# Patient Record
Sex: Female | Born: 1935 | Race: White | Hispanic: No | Marital: Married | Smoking: Never smoker
Health system: Southern US, Community
[De-identification: ages and names within clinical notes are randomized; demographics above are authoritative.]

## PROBLEM LIST (undated history)

## (undated) DIAGNOSIS — I10 Essential (primary) hypertension: Secondary | ICD-10-CM

## (undated) DIAGNOSIS — E039 Hypothyroidism, unspecified: Secondary | ICD-10-CM

## (undated) DIAGNOSIS — E785 Hyperlipidemia, unspecified: Secondary | ICD-10-CM

## (undated) DIAGNOSIS — I4891 Unspecified atrial fibrillation: Secondary | ICD-10-CM

## (undated) DIAGNOSIS — K219 Gastro-esophageal reflux disease without esophagitis: Secondary | ICD-10-CM

## (undated) DIAGNOSIS — I2510 Atherosclerotic heart disease of native coronary artery without angina pectoris: Secondary | ICD-10-CM

## (undated) DIAGNOSIS — Z950 Presence of cardiac pacemaker: Secondary | ICD-10-CM

## (undated) HISTORY — PX: PACEMAKER INSERTION: SHX728

## (undated) HISTORY — PX: ABDOMINAL HYSTERECTOMY: SHX81

## (undated) HISTORY — PX: CARDIAC SURGERY: SHX584

---

## 1987-03-05 HISTORY — PX: CHOLECYSTECTOMY: SHX55

## 2003-03-05 HISTORY — PX: CORONARY ARTERY BYPASS GRAFT: SHX141

## 2003-11-15 ENCOUNTER — Inpatient Hospital Stay (HOSPITAL_COMMUNITY): Admission: RE | Admit: 2003-11-15 | Discharge: 2003-12-02 | Payer: Self-pay | Admitting: Cardiothoracic Surgery

## 2003-11-15 ENCOUNTER — Ambulatory Visit: Payer: Self-pay | Admitting: Cardiothoracic Surgery

## 2003-11-17 ENCOUNTER — Encounter (INDEPENDENT_AMBULATORY_CARE_PROVIDER_SITE_OTHER): Payer: Self-pay | Admitting: *Deleted

## 2003-12-23 ENCOUNTER — Encounter: Admission: RE | Admit: 2003-12-23 | Discharge: 2003-12-23 | Payer: Self-pay | Admitting: Cardiothoracic Surgery

## 2004-10-08 ENCOUNTER — Ambulatory Visit: Payer: Self-pay | Admitting: *Deleted

## 2004-10-12 ENCOUNTER — Ambulatory Visit: Payer: Self-pay | Admitting: *Deleted

## 2004-11-20 ENCOUNTER — Ambulatory Visit: Payer: Self-pay | Admitting: *Deleted

## 2005-01-31 ENCOUNTER — Ambulatory Visit: Payer: Self-pay | Admitting: *Deleted

## 2005-03-06 ENCOUNTER — Ambulatory Visit: Payer: Self-pay | Admitting: *Deleted

## 2005-04-02 IMAGING — CR DG CHEST 2V
2 series · 2 of 2 positions shown · non-contrast
Comparison: none

CLINICAL DATA: Post-op open heart surgery.  Dyspnea.  
 PA AND LATERAL CHEST: 
 Comparison 11/25/03.  There is stable cardiomegaly status post median sternotomy, CABG, and tricuspid valve replacement.  Mild vascular congestion is present, but there is no overt pulmonary edema.  The bilateral pleural effusions have improved from the prior study.  Mild bibasilar atelectasis remains.  There is no pneumothorax.

[view not recorded (1 of 2)]
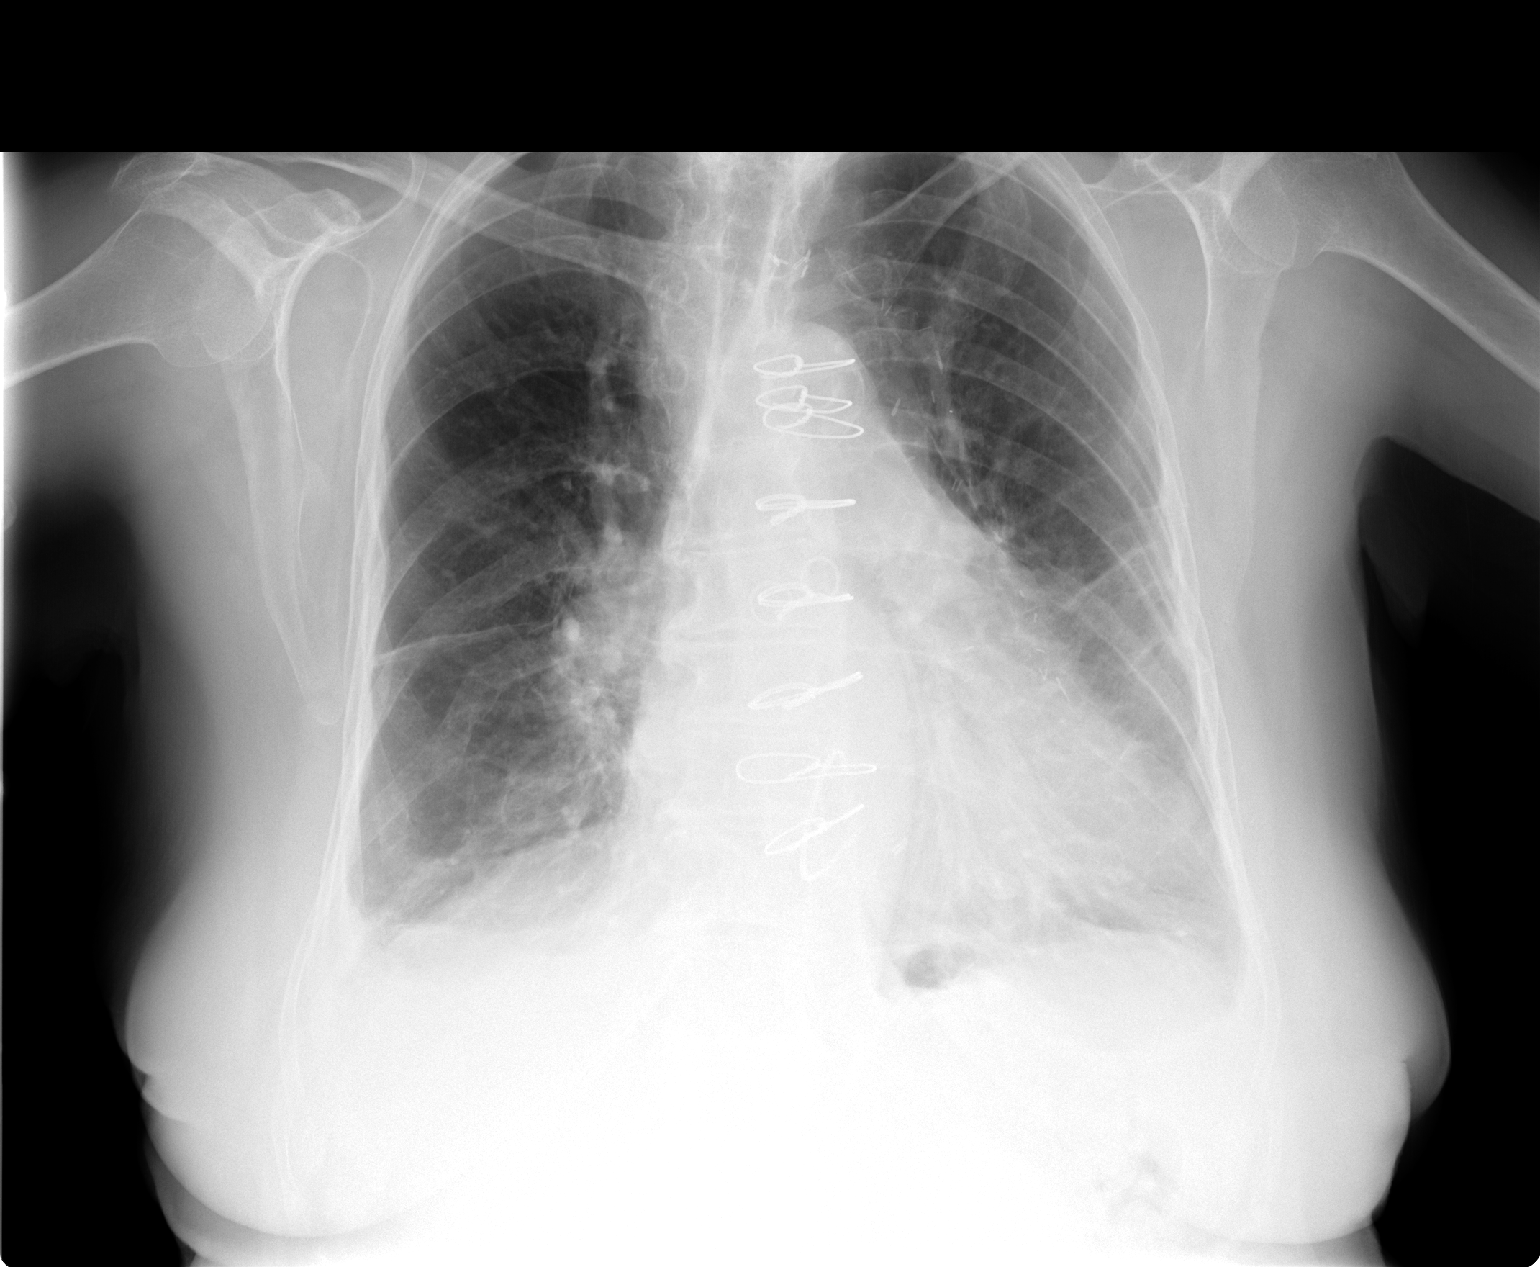

[view not recorded (2 of 2)]
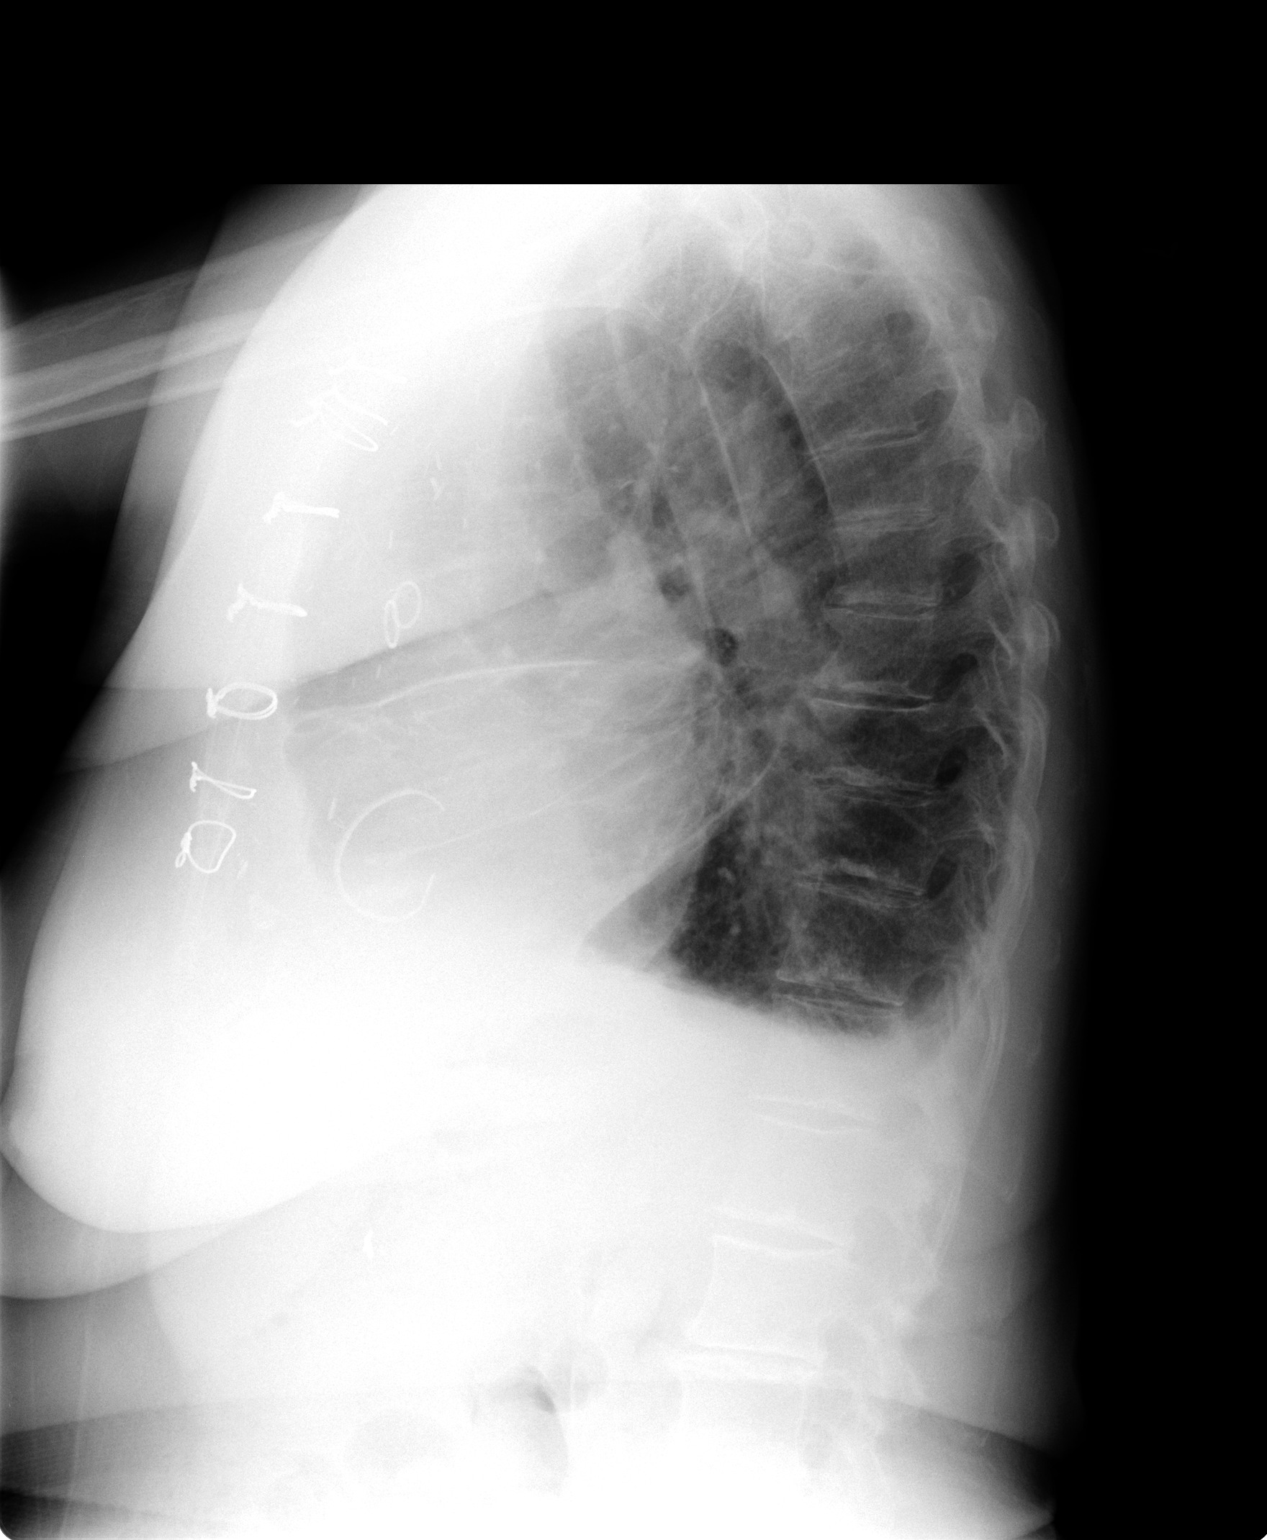

[2 of 2 positions shown; findings below may reference images not displayed]

IMPRESSION: Improved bilateral pleural effusions since 11/25/03.  There is persistent vascular congestion with mild bibasilar atelectasis.

## 2005-05-24 ENCOUNTER — Ambulatory Visit: Payer: Self-pay | Admitting: *Deleted

## 2005-09-05 ENCOUNTER — Ambulatory Visit: Payer: Self-pay | Admitting: *Deleted

## 2006-10-22 ENCOUNTER — Ambulatory Visit: Payer: Self-pay | Admitting: Internal Medicine

## 2006-11-24 ENCOUNTER — Ambulatory Visit: Payer: Self-pay | Admitting: Cardiothoracic Surgery

## 2009-04-18 ENCOUNTER — Observation Stay: Payer: Self-pay | Admitting: Internal Medicine

## 2009-09-01 DIAGNOSIS — Z950 Presence of cardiac pacemaker: Secondary | ICD-10-CM

## 2009-09-01 HISTORY — DX: Presence of cardiac pacemaker: Z95.0

## 2009-09-25 ENCOUNTER — Ambulatory Visit: Payer: Self-pay | Admitting: Cardiology

## 2009-09-26 ENCOUNTER — Ambulatory Visit: Payer: Self-pay | Admitting: Cardiology

## 2010-03-24 ENCOUNTER — Encounter: Payer: Self-pay | Admitting: Cardiothoracic Surgery

## 2010-07-20 NOTE — Op Note (Signed)
NAME:  ZALAYA, ASTARITA                   ACCOUNT NO.:  [id number]   MEDICAL RECORD NO.:  [id number]                   PATIENT TYPE:  INP   LOCATION:  2311                                 FACILITY:  [facility]   PHYSICIAN:  Zenon Mayo, MD            DATE OF BIRTH:  [birth year]   DATE OF PROCEDURE:  11/18/2003  DATE OF DISCHARGE:                                 OPERATIVE REPORT   PROCEDURE:  Intraoperative transesophageal echocardiogram.   SURGEON:  Kerin Perna, M.D.   INDICATION:  Possible patent foramen ovale, possible atrial septal defect.   DESCRIPTION:  Mrs. Amero is an 77 year old female with a history of  hypertension and coronary artery disease who had previously been brought to  the operating room for coronary artery bypass surgery.  After completion of  the surgery, the patient had difficulty weaning from the ventilator and  after transthoracic echocardiogram was done, it was felt that she had a  shunt physiology that was consistent with either an atrial septal defect or  a patent foramen ovale.  With these findings on echocardiogram and her  inability to wean from the ventilator, she was brought back to operating  room today for repair of this defect.  Mrs. Pavlock was brought to the  operating room and placed under general anesthesia.  A transesophageal  Echoprobe was placed without any resistance to placement.  Initial findings  on exam include:  Left ventricle:  The left ventricle was normal in  appearance and function.  There were no regional wall motion abnormalities,  and her ejection fraction is estimated to be 55%.  The mitral valve was then  imaged.  The mitral valve appeared to coapt well;  however, when color  Doppler was placed on it, there did appear to be mild mitral regurgitation.  The mitral regurgitation did not inhibit normal pulmonary venous flow into  the left atrium, and there was no thrombus seen in the left atrial  appendage.  The  aorta was imaged next.  The aorta was tricuspid in nature,  normal in appearance and function.  There was trace aortic regurgitation  noted.  Upon imaging the interatrial septum, there did seem to be a defect.  There was 1.1 cm long absence of atrial septum seen on planimetry.  When  color Doppler was placed on this area, a bidirectional shunt was noted with  flow predominantly left to right.  The right atrium was mildly dilated to 42  mm, and the right ventricle was severely dilated to 43 mm.  This dilation of  the ventricle as well as the annulus resulted in severe tricuspid  regurgitation.  The pulmonic valve was normal in appearance.  No pulmonic  regurgitation noted.   At the conclusion of cardiopulmonary bypass, the heart was again imaged with  transesophageal Echoprobe.  The pericardial patch placed by Dr. Donata Clay  appeared in good position with no flow across the interatrial septum.  A  ring was placed in the tricuspid position, and there was no tricuspid  regurgitation seen.  The mitral regurgitation that was present prior to  going on bypass remained similar in quantity, and left ventricular function  was well preserved.  Mrs. Brasil was taken from the operating room  directly to the intensive care unit in stable condition after the  transesophageal Echoprobe was removed without incident.      WEF/MEDQ  D:  11/18/2003  T:  11/20/2003  Job:  [id number]

## 2010-07-20 NOTE — Discharge Summary (Signed)
NAME:  Rose Gates, Rose Gates NO.:  [id number]   MEDICAL RECORD NO.:  [id number]          PATIENT TYPE:  INP   LOCATION:  2014                         FACILITY:  [facility]   PHYSICIAN:  Kerin Perna, M.D.  DATE OF BIRTH:  [birth year]   DATE OF ADMISSION:  11/15/2003  DATE OF DISCHARGE:  12/02/2003                                 DISCHARGE SUMMARY   ADDENDUM:  Ms. Celmer was originally felt to be a candidate for the  subacute care unit and possible discharge on November 27, 2003, or  November 28, 2003; however, a bed was not available.  She was felt not to  be a candidate for nursing home placement at that time and would require a  few more days of inpatient care.  Additionally during this time frame, she  had some rhythm difficulties with atrial fibrillation and frequent premature  atrial contractions and medications were adjusted and this has improved.  Currently she is in a normal sinus rhythm.  Overall her status is otherwise  felt to be tentatively stable for discharge on December 02, 2003, with home  health arrangements for both physical therapy, registered nurse and  outpatient follow-up with speech therapy as she is on a dysphagia III diet.   DISCHARGE MEDICATIONS:  1.  Enteric coated aspirin 325 mg daily.  2.  Aciphex 20 mg daily as preoperatively.  3.  Toprol XL 50 mg daily.  4.  For pain, Darvocet one or two every four to six hours as needed.   DIET:  She should continue her dysphagia III diet.  Speech therapy  appointment has been arranged for follow-up for Thursday, December 15, 2003,  at 9 a.m.   FOLLOW UP:  Kerin Perna, M.D., on December 23, 2003.  Additionally, she  is to follow up with Dr. __________ in two weeks and she is instructed to  make these arrangements.   CONDITION ON DISCHARGE:  Stable and improved.   FINAL DIAGNOSES:  1.  Class III angina with positive stress test necessitating surgical      revascularization as described to  include coronary artery bypass      grafting x3.  2.  PFO, status post second surgical procedure for closure as well as      tricuspid valve repair.  3.  Previous hysterectomy.  4.  Previous lumbar disc surgery x2.  5.  Previous cholecystectomy.  6.  Previous tympanoplasty.  7.  History of bilateral renal artery stenosis.  8.  History of hypertension.  9.  Sever varicose veins.       WEG/MEDQ  D:  12/01/2003  T:  12/02/2003  Job:  [id number]   cc:   Evonnie Pat, M.D.   Pablo Ledger, M.D.

## 2010-07-20 NOTE — Discharge Summary (Signed)
NAME:  Rose Gates, Rose Gates NO.:  [id number]   MEDICAL RECORD NO.:  [id number]          PATIENT TYPE:  INP   LOCATION:  2014                         FACILITY:  [facility]   PHYSICIAN:  Kerin Perna, M.D.  DATE OF BIRTH:  Sep 05, 1919   DATE OF ADMISSION:  11/15/2003  DATE OF DISCHARGE:  11/28/2003                                 DISCHARGE SUMMARY   ADMISSION DIAGNOSES:  Severe two-vessel coronary artery disease with Class  IV angina.   PAST MEDICAL HISTORY:  Hypertension.   PAST SURGICAL HISTORY:  1.  Hysterectomy.  2.  Lumbar disk surgery x 2.  3.  Cholecystectomy.  4.  Tympanoplasty.   ALLERGIES:  PENICILLIN causes her mouth to swell.  She is intolerant to  CODEINE due to GI side effects.   BRIEF HISTORY:  Rose Gates is an 77 year old Caucasian female.  She was  referred to Dr. Donata Clay by Dr. Juliann Pares and Dr. Guinevere Scarlet and was evaluated  at the [facility] office on October 14, 2003.  Rose Gates has a history of chest  pain consistent with angina and was evaluated by Dr. Juliann Pares and found to  have a high-grade stenosis of LAD, diagonal, and 80% stenosis of distal  right coronary artery. Ejection fraction is well preserved, and  circumflex  had no significant disease.  Aortogram revealed bilateral 70% stenoses of  radial arteries.  Her situation was discussed between Dr. Juliann Pares and Dr.  Guinevere Scarlet who felt surgical revascularization following coronary  revascularization followed by renal artery angioplasty would be the best  course of therapy.  A 2-D echocardiogram was also performed by Dr. Juliann Pares  which showed normal LV function.  No mitral regurgitation, no elevated right-  sided pressures.  After Dr. Donata Clay examined Rose Gates and reviewed  all the available records, he felt that proceeding with coronary artery  bypass grafting was preferred choice for this woman.  The procedure, risks,  benefits were all discussed with Rose Gates and her family.  They  agreed  to proceed with surgery.  She was scheduled as an elective admission.   HOSPITAL COURSE:  On November 15, 2003, Rose Gates was electively  admitted to [facility] in the care of Dr. Kathlee Nations Trigt.  She  underwent the following procedures:  1) coronary artery bypass grafting x 3  with grafts placed at time of procedure, left internal mammary artery graft  to left anterior descending artery, saphenous vein graft to the diagonal  artery, saphenous vein graft to posterior descending artery.  Vein was  harvested from the left thigh via endoscopic vein harvesting technique.  At  the conclusion of the procedure and time of transfer to the SICU, her O2  saturation decreased.  Her ABG measurements were acceptable.  Chest x-ray  revealed only mild edema, no pneumothorax.  There was no DVT evident by  duplex examination, and a 2-D echocardiogram was negative for any obvious  right-to-left shunt in her heart.   She remained difficult to oxygenate, and remained ventilator dependent  through postoperative day #1.  Pulmonary critical care medicine was  consulted, and she was evaluated  by Dr. Delford Field.  After examination of Ms.  Gates and review of all available records, he felt she had right-to-left  shunt physiology without any obvious explanation.  Spiral CT was performed  on September 14 and revealed no PE, no evidence of ARDS.  She remained  ventilator dependent.  Because of this situation. Her nutrition status wsz  supplemented with tube feedings via panda tube.   On September 15, a 2-D echocardiogram with bubble study was performed, and  this was positive for right-to-left atrial level shunt with evidence of  pulmonary hypertension.  Cardiology consultation was requested for further  evaluation.  Dr. Dorethea Clan examined Rose Gates as well as the medical  records.  He recommended transesophageal echocardiogram for further  evaluation of her cardiac anatomy.  Dr. Zenaida Niece Trigt's  recommendation was to  proceed with repair of her patent foramen ovale.  He felt that not  proceeding with surgical closure of the patent foramen ovale would result in  further ventilator dependence with eventual result of ventilator-requiring  pneumonia, further deconditioning, and poor nutrition.  This procedure and  the risks and benefits were discussed with Rose Gates family, and they  agreed to proceed with the surgery.  The transesophageal echocardiogram was  carried out prior to proceeding with surgery.   On November 18, 2002, Rose Gates was returned to the operating room and  underwent the following surgical procedure by Dr. Donata Clay:  1) Closure of  patent foramen ovale with pericardial tissue patch.  2) Tricuspid valve  repair with 34 mm McCarthy-Adams ring.  TEE performed after her pump run  revealed no atrial shunt and no significant tricuspid or mitral valve  regurgitation.  She was transferred back to intensive care unit in stable  but guarded condition.  She remained hemodynamically stable in immediate  postoperative period and remained on ventilator through postoperative day  #1.  She was extubated in the morning of postoperative day #2 and awoke from  anesthesia neurologically intact.  At this time, Dr. Donata Clay felt that Ms.  Gates was high risk for aspiration; therefore, her nutrition was changed  to TNA.  He also requested speech therapy evaluation.   On November 21, 2003, she underwent a fiberoptic endoscopic evaluation of  the swallow exam.  This revealed moderate transesophageal dysphagia.  The  recommendation was for dysphagia II diet and honey-thick liquids as well as  full assistance with all p.o. intake.  She also underwent PT evaluation.  They felt she would most likely need SACU stay prior to discharge home.  Physical medicine rehabilitation consult was also obtained, and they concurred that SACU or inpatient rehab stay was indicated when she  was  medically ready for this level of care.   Over the next several days, Rose Gates began making some progress.  She  did have some intermittent confusion, and she was generally deconditioned.  She made sufficient progress to transfer to unit 2000 on September 21,  postoperative day #8/5.  Modified barium swallow exam was performed on  September 23.  This revealed improvement of dysphagia to mild to moderate  level.  Her diet was advanced to dysphagia III with nectar-thick liquids.  Speech therapy recommendation was to repeat modified barium swallow in one  to two weeks.  While on unit 2000, Rose Gates has continued to make good  progress recovering from her surgery.  The remainder of her stay has been  uneventful.   On November 28, 2003, postoperative day #9 after  closure of patent foramen  ovale and postoperative day #12 after coronary artery bypass on morning  rounds she reports feeling well.  Vital signs were stable.  Blood pressure  128/60.  She is afebrile.  Her oxygen saturation is 95% on 2 liters.  Her  heart is maintaining normal sinus rhythm at 73 beats per minute.  Lungs are  clear to auscultation.  She is eating 25 to 50% of her meals.  She does need  reinforcement of no thin liquids for now.  She does understand the reasons  for this.  Her bowel and bladder functions are within normal limits for her.  Her incisions are healing well.  She does continue to have cardial pacing  wires and chest tube sutures.  She has minimal lower extremity edema.  She  is ambulating in the hallway with her rolling walker with minimum  assistance.  Her pain control is adequate.   If Rose Gates continues to make steady progress, will be ready for  transfer to [facility] level of care tomorrow, November 28, 2003.  Her pacing  wires and chest tube sutures will be removed prior to transfer.   CONDITION ON DISCHARGE:  Improved.   Instructions on discharge include medications.  1.   Enteric-coated aspirin 325 mg daily.  2.  Colace 200 mg p.o. daily.  3.  Protonix 40 mg p.o. daily.  4.  Xanax 0.25 mg p.o. q.h.s.  5.  Lasix 40 mg p.o. daily.  6.  Potassium chloride 10 mEq p.o. b.i.d.  7.  Lopressor 25 mg p.o. b.i.d.  8.  For pain, she may have Darvocet 1 to 2 p.o. q.4h. p.r.n. for moderate to      severe pain with Tylenol 325 mg 1 to 2 p.o. q.4h. for mild pain.   Diet should continue to be dysphagia III with nectar-thick liquids.  She is  to supplement her diet with Ensure vanilla pudding b.i.d.   ACTIVITY:  Rose Gates is to continue to increase her activity daily.  She  is to continue walking with assistance of a rolling walker as well as to  participate in physical therapy and occupational therapy activities as per  their recommendations.   WOUND CARE:  Her incisions are to be cleaned daily with soap and water.  If  any signs of infection, Dr. Donata Clay is to be notified.  She may shower.  FOLLOW UP:  [facility] will continue to follow Rose Gates's progress while she  is on the subacute care unit.  Outpatient followup in the office will be  arranged closer to discharge home.       CTK/MEDQ  D:  11/27/2003  T:  11/27/2003  Job:  [id number]   cc:   Romona Curls, M.D.   Park Liter, M.D.

## 2010-07-20 NOTE — Op Note (Signed)
NAME:  Rose Gates, BAUS                   ACCOUNT NO.:  [id number]   MEDICAL RECORD NO.:  [id number]                   PATIENT TYPE:  INP   LOCATION:  2311                                 FACILITY:  [facility]   PHYSICIAN:  Kerin Perna III, M.D.           DATE OF BIRTH:  1919-10-30   DATE OF PROCEDURE:  11/15/2003  DATE OF DISCHARGE:                                 OPERATIVE REPORT   OPERATION:  Coronary artery bypass graft surgery x3 (left internal mammary  artery to the left anterior descending coronary artery, saphenous vein graft  to the diagonal, saphenous vein graft to diagonal, saphenous vein graft to  the posterior descending).   PREOPERATIVE DIAGNOSES:  Class 3 angina with a positive stress test and  severe two-vessel coronary artery disease.   POSTOPERATIVE DIAGNOSES:  Class 3 angina with a positive stress test and  severe two-vessel coronary artery disease.   SURGEON:  Kerin Perna, M.D.   ASSISTANT:  Eber Jones A. Thelma Barge, P.A.-C.   ANESTHESIA:  General by Dr. Maren Beach.   INDICATIONS FOR PROCEDURE:  The patient is an 77 year old female who had  developed exertional chest pain. A stress test was positive.  A cardiac  catheterization in [place] by Dr. Juliann Pares demonstrated severe LAD  diagonal stenosis and stenosis of the distal right coronary artery.  Her  ejection fraction was normal.  She is felt to be a candidate for surgical  revascularization.  I examined the patient in the office, and reviewed the  results of the cardiac catheterization and echocardiogram with the patient  and with her family.  She was also diagnosed with a bilateral renal artery  stenosis.  She was felt to be a potential candidate for a renal artery  stenting after her coronary revascularization procedure.  With the family  and with the patient I discussed the major aspects of the planned surgical  procedure, including the choice of conduit for grafts, the location of the  surgical incisions, the use of general anesthesia and cardiopulmonary  bypass, and the expected postoperative hospital recovery.  I discussed with  the patient the risks of surgery, including the risks of myocardial  infarction, cardiovascular, bleeding, blood transfusion requirement,  infection and death.  She understood these implications for the surgery, and  agreed to proceed with the operation as planned under what I felt was an  informed consent.   FINDINGS:  The patient has severe bilateral varicose veins below the knee of  each extremity.  The vein was harvested endoscopically from the left thigh,  and the vein was adequate.  The patient has no further vein, as vein mapping  of the right leg showed the right thigh vein to be extremely dilated and  unusable.  The patient was given a protenin protocol for the cardiopulmonary bypass  perfusion.  The patient received 2 units of blood on bypass for a hemoglobin  of 7 grams.   DESCRIPTION OF PROCEDURE:  The patient was brought to the operating room and  placed supine on the operating room table, where general anesthesia was  induced under invasive hemodynamic monitoring. The chest, abdomen and legs  were prepped with Betadine and draped as a sterile field.  A sternal  incision was made, as the saphenous vein was harvested from the left thigh  using endoscopic technique.  The left internal mammary artery was harvested  as a pedicle graft from its origin at the subclavian vessels.  It was a  small 1.2 mm vessel, but had adequate flow.  Heparin was administered and  the ACT was documented as being therapeutic.  The sternal retractor was  placed and the pericardium was opened and suspended.  Pursestring sutures  were placed in the ascending aorta and the right atrium, and the patient was  cannulated and placed on bypass.  Cardioplegia cannulae were placed, both in  the ascending aorta and into the coronary sinus for antegrade and for   retrograde delivery of cold blood cardioplegia.  The coronaries were  identified for grafting and the mammary artery vein grafts were prepared for  the distal anastomoses.  The patient was cooled to 32 degrees.  The cross  clamp was applied.  A total of 800 mL of cold blood cardioplegia was  delivered in split doses between the antegrade aortic and retrograde  coronary sinus catheters.  There was a good cardioplegic arrest, and the  septal temperature reached 10 degrees.  Topical iced saline was used to  augment the myocardial preservation.  The distal coronary anastomoses were then performed.  The first distal  anastomosis was to the posterior descending.  This was a 1.5 mm vessel with  an approximately 80% to 90% stenosis.  A reverse saphenous vein was sewn end-  to-side with a running #7-0 Prolene with good flow through the graft.  A  second distal anastomosis was of the diagonal.  This was a 1.4 mm vessel  with a proximal 90%.  A reverse saphenous vein was sewn end-to-side with  running #7-0 Prolene, with good flow through the graft.  Cardioplegia was  redosed.  The third distal anastomosis was to the distal third of the LAD.  More proximally it was inter-myocardial.  The left IMA pedicle was brought  through an opening created in the left lateral pericardium.  It was brought  down onto the LAD and sewn end-to-side with a running #8-0 Prolene.  There  was excellent flow through the anastomosis, with the immediate rise of the  septal temperature after release of the pedicle clamp on the mammary artery.  The mammary pedicle clamp was then replaced, and the pedicle was secured to  the epicardium.  Cardioplegia was redosed.  While the cross clamp was still in place, two  proximal vein anastomoses were placed on the ascending aorta using a 4.0 mm  punch and running #7-0 Prolene.  The air was removed from the coronaries in the left side of the heart using a dose of retrograde warm blood   cardioplegia as well as the usual deairing maneuvers on bypass.  The cross  clamp was removed.  The grafts were inspected and found to be hemostatic,  with good flow.  The patient was rewarmed to 37 degrees.  The temporary  pacing wires were applied.  The lungs re-expanded and the ventilator was  resumed.  The patient was weaned from bypass with stable vital signs and  good cardiac output.  Protamine was administered without  adverse reaction.  The cannulae were removed.  The mediastinum was irrigated with warm  antibiotic irrigation.  The superior pericardial tissue was closed over the  aorta and the vein grafts.  Two mediastinal and a left pleural chest tube  were placed and brought out through separate incisions.  The sternum was  then closed with interrupted stainless steel wires.  The pectoralis fascia  was closed with running #1 Vicryl.  The subcutaneous and skin layers were  closed with a running Vicryl.  Sterile dressings were applied.  The total  bypass time was 95 minutes, with the cross clamp time of 55 minutes.                                               Mikey Bussing, M.D.    PV/MEDQ  D:  11/15/2003  T:  11/15/2003  Job:  [id number]   cc:   Park Liter, M.D.  [place], [place]

## 2010-07-20 NOTE — Consult Note (Signed)
NAME:  Rose Gates, Rose Gates                   ACCOUNT NO.:  [id number]   MEDICAL RECORD NO.:  [id number]                   PATIENT TYPE:  INP   LOCATION:  2311                                 FACILITY:  [facility]   PHYSICIAN:  Vida Roller, M.D.                DATE OF BIRTH:  September 14, 1919   DATE OF CONSULTATION:  11/17/2003  DATE OF DISCHARGE:                                   CONSULTATION   HISTORY OF PRESENT ILLNESS:  Ms. Betton is an 77 year old woman who has  severe two vessel coronary artery disease.  She was catheterized in  [place], [place], by Dr. Guinevere Scarlet and found to have severe two  vessel disease in her LAD and right coronary artery with normal left  ventricular systolic function.  She was referred to Dr. Donata Clay for  surgical revascularization and she underwent bypass surgery on November 15, 2003.  Unfortunately, after that surgery, she has been extremely difficult  to oxygenate and has required high levels of oxygen with attempts to wean  from her ventilator unsuccessful.  Dr. Delford Field was involved in her care and  cannot find a significant pulmonary cause for this problem.  She has had  chest CT which shows no evidence of pulmonary embolus.  However, her  echocardiogram was concerning for right to left shunt and a bubble contrast  study done today shows significant right to left shunting at the level of  the atria and we were asked to comment about addressing her pulmonary  hypertension pharmacologically as well as definitive therapy for her  intracardial shunt.   MEDICATIONS ON ADMISSION:  Xanax 0.25 mg b.i.d., Lipitor 10 mg daily, Imdur  60 mg daily, Cardizem 180 mg daily, Aciphex 20 mg daily, Toprol XL 25 mg  daily, and aspirin 81 mg daily.   ALLERGIES:  Penicillin which causes her mouth to swell.  She is intolerant  to codeine, it causes her GI upset.   PAST MEDICAL HISTORY:  Significant for renal artery stenosis.  She was found  to have significant  bilateral renal artery stenosis on her catheterization.  She has hypertension and mild chronic obstructive pulmonary disease which  was thought to be secondary to distant tobacco use.   SOCIAL HISTORY:  She is retired.  She does not smoke or use any alcohol.  She is married.  She has a daughter who is involved in her health care and a  granddaughter who is a Engineer, civil (consulting).  She has a family history significant for  coronary artery disease, diabetes.   REVIEW OF SYMPTOMS:  Generally negative.  She is currently intubated, so  details are available only through the medical record.   PHYSICAL EXAMINATION:  GENERAL:  She is sedated and intubated.  VITAL SIGNS:  Blood pressure 108/50, heart rate 82.  She is on a ventilator  setting as per Dr. Lynelle Doctor orders on a relatively high dose of oxygen, I  believe she is on  70% FIO2 currently.  HEENT:  Unremarkable except for the tubes.  NECK:  Supple, she has no obvious jugular venous distention or carotid  bruits.  CHEST:  Diffusely coarse breath sounds from the ventilator, no obvious  rales.  CARDIOVASCULAR:  Exam is regular.  She has a soft 1/6 systolic murmur heard  best at the apex.  Median sternotomy scar intact.  ABDOMEN:  Soft, nontender.  EXTREMITIES:  Lower extremities without significant cyanosis, clubbing, and  edema.  Her vein harvest sites appear intact.   LABORATORY DATA:  White blood cell count 13,000, hemoglobin 10, hematocrit  30, platelet count 120,000.  On 70% FIO2, her blood gases are 7.40, pCO2 30,  pO2 71, 95% saturation.  PT 15.6, PTT 36.  Sodium 140, potassium 3.3,  chloride 112, bicarb 23, BUN 17, creatinine 1, blood sugar 180.  Liver  function tests within normal limits.  Reviewing her echocardiogram, there is  clear evidence of a shunt in the atrial area.   ASSESSMENT:  1.  Ventilator dependent respiratory failure felt to be secondary to      pulmonary hypertension due to her right to left shunt.  2.  Intracardiac right to  left shunt which appears to be at the level of the      atria with some evidence of pulmonary hypertension on her      echocardiogram.  3.  Coronary artery disease status post bypass surgery a couple of days ago      with a LIMA to the LAD, saphenous vein graft to first diagonal,      saphenous vein graft to the second diagonal, saphenous vein graft to the      posterior descending artery.  She has normal LV function on both echo      and cath.  4.  Severe peripheral vascular disease with bilateral renal artery stenosis      which is pending percutaneous revascularization.  She has hypertension      although currently not manifesting and mild COPD.   RECOMMENDATIONS:  1.  Not to use calcium channel blockers as previously planned, this can      precipitate relatively profound hypotension and probably is not a      reasonable thing to do, even though she is in the intensive care unit.  2.  Continue the high levels of oxygen as a very safe pulmonary vasodilator      and, at this point, although inhaled nitrous oxide would be a very      useful to try to use in her case, I think at this point it is      unavailable at [facility].  3.  We have arranged for her to have a transesophageal echocardiogram to be      performed at 11 tomorrow under the care of my colleague, Dr. Olga Millers.  This will help [place] define the intracardiac anatomy and      determine whether she needs surgical or percutaneous revascularization.      If she needs percutaneous revascularization that is not available in      this hospital, then consideration may be made for transfer either to      [facility] or some other facility that has this capability and,      obviously, consideration should be made also for surgical correction of      this intracardiac shunt.   I am going to ask my colleague, Dr. Michelle Piper  DeGent, to comment on this further as his expertise is in pulmonary hypertension.                                                Vida Roller, M.D.    JH/MEDQ  D:  11/17/2003  T:  11/17/2003  Job:  [id number]   cc:   Mikey Bussing, M.D.  [address]  [place]  [place] [address]   Shan Levans, M.D. [facility]

## 2010-07-20 NOTE — Op Note (Signed)
NAME:  Rose Gates, Rose Gates                   ACCOUNT NO.:  [id number]   MEDICAL RECORD NO.:  [id number]                   PATIENT TYPE:  INP   LOCATION:  2311                                 FACILITY:  [facility]   PHYSICIAN:  Kerin Perna III, M.D.           DATE OF BIRTH:  Jul 16, 1919   DATE OF PROCEDURE:  11/18/2003  DATE OF DISCHARGE:                                 OPERATIVE REPORT   OPERATION PERFORMED:  1.  Closure of patent foramen ovale with a pericardial patch.  2.  Tricuspid valve annuloplasty with a 34 mm Edwards annuloplasty ring      (model #[id number], serial [id number]).   PREOPERATIVE DIAGNOSIS:  Patent foramen ovale with right to left shunt and  desaturation of systemic circulation with 3+ tricuspid regurgitation.   POSTOPERATIVE DIAGNOSIS:  Patent foramen ovale with right to left shunt and  desaturation of systemic circulation with 3+ tricuspid regurgitation.   SURGEON:  Kerin Perna, M.D.   ASSISTANT:  Pecola Leisure, PA   ANESTHESIA:  General.   ANESTHESIOLOGIST:  Zenon Mayo, MD   INDICATIONS FOR PROCEDURE:  The patient is an 77 year old female who three  days previously had undergone surgical coronary revascularization for severe  two-vessel coronary artery disease.  Postoperatively she had refractory  desaturation of systemic circulation requiring 90 to 100% FIO2.  Her  pulmonary evaluation showed no significant intrinsic lung disease and she  had no evidence of pulmonary emboli.  An initial chest wall Color Flow  Doppler exam done immediately postoperatively showed no evidence of an  atrial shunt or atrial defect.  A follow-up echo study with a bubble study  showed a right to left shunt and it was felt that surgical re-exploration  and surgical closure of the atrial defect would be the best option.   I discussed the patient's condition and situation at length with the family  prior to this operation.  I felt that the options included continued  medical  management which was not showing signs of improving with expected prolonged  ventilator dependence, surgical closure of the atrial defect to give her the  best chance for early extubation and recovery from surgery, or transport to  another hospital center for a percutaneous intervention to close the atrial  defect.  It was my feeling that transporting this patient on a transport  type ventilator would be at risk for desaturation and an unstable patient.  My recommendation to the patient's family was that we take the patient back  to surgery to close the defect and to perform a transesophageal  echocardiogram prior to surgery.  They understood that reoperating would  pose risks to this 77 year old female; however, I felt that it was her best  option for recovery from the surgery.  The patient's family agreed to  proceed with the procedure under what I felt was an informed consent.   OPERATIVE FINDINGS:  The inner atrial septum had a patent foramen ovale  measuring approximately 1.7 cm long and 7 mm wide.  This was patched with a  pericardial patch.  The transesophageal echocardiogram prior to surgery  showed significant tricuspid regurgitation with a regurgitant jet directed  at the inner atrial defect.  It was decided to perform a tricuspid  annuloplasty ring while the right atrium was opened to close the interatrial  defect.  The patient's LV systolic function was well preserved.   DESCRIPTION OF PROCEDURE:  The patient was brought directly to the operating  room from the ICU where she was prepped and draped and induced with general  anesthesia.  The transesophageal 2-D echo probe was placed by the  anesthesiologist, which confirmed the diagnosis of an interatrial defect  with right to left shunting, mainly from a systolic regurgitant jet from  tricuspid insufficiency.  It was decided to correct those problems at the  time of this operation.  A redo sternal incision was made and  the sternum  was opened and the tissues irrigated gently with warm saline.  The sternal  retractor was placed.  Heparin was administered and the ACT was documented  as being therapeutic.  Pursestrings were placed in the ascending aorta and  into the superior vena cava and the patient was cannulated and placed on  bypass.  A second venous return cannula was placed into the right atrium  just proximal to the inferior vena cava.  Heavy vessel loops were then  encircled around each caval cannula.  A cardioplegia cannula was placed in  the ascending aorta.  An atraumatic vascular bulldog clamp was placed on the  mammary artery pedicle and the cross-clamp was applied.  A liter of cold  blood cardioplegia was instilled into the aortic root and grafts and this  resulted in a satisfactory hypothermic cardioplegic arrest with septal  temperature dropping less than 12 degrees.  Topical iced saline was used to  augment myocardial preservation.   The caval tapes were tightened and a right atriotomy was performed.  The  patent foramen defect was immediately visible in the area of the fossa  ovalis.  A piece of pericardium was thickened and rinse in glutaraldehyde  and cut to an appropriate size.  It was then used to patch the defect using  running 4-0 Prolene in two layers.   Next, after redosing cardioplegia, the tricuspid valve was inspected.  There  was no structural defect; however, the central regurgitant jet was felt to  be due to right-sided dilatation.  The annuloplasty sutures were placed  using 2-0 Ethibond circumferentially around the annulus avoiding the area of  the conduction system.  The annulus was then sized to a 34 mm annuloplasty  ring Randa Evens MC3 tricuspid annuloplasty ring).  The annuloplasty sutures  were then placed through the ring and the ring was seated and the sutures  were tied.  The valve was tested and found to be completely competent.  The Swan catheter was then  replaced into the right ventricle and RV outflow  tract and atriotomy was closed in two layers using running 4-0 Prolene.  Air  was vented from the heart using the usual deairing maneuvers prior to  release of the cross-clamp.  The heart resumed a spontaneous rhythm.  The  temporary pacing wires were  re-established.  The patient was rewarmed to 37  degrees.  The lungs were re-expanded and the ventilator was resumed.  The  patient was then weaned from bypass on low-dose dopamine and milrinone.  Cardiac output  and blood pressure were stable.  Protamine was administered  without adverse reaction.  The cannulas were removed.  The atriotomy  incision was hemostatic.  The superior pericardial fat was closed over the  aorta and vein grafts.  A left pleural and two mediastinal chest tubes were  placed and brought out through separate incisions.  The sternum was closed  with interrupted steel wire.  The pectoralis fascia was closed with a  running #1 Vicryl.  The subcutaneous and skin were closed with a running  Vicryl and sterile dressings were applied.  Total bypass time was 120  minutes with a cross-clamp time of 78 minutes.      PV/MEDQ  D:  11/18/2003  T:  11/21/2003  Job:  [id number]   cc:   Dr. Park Liter, [facility]

## 2014-10-07 ENCOUNTER — Emergency Department: Payer: Medicare Other

## 2014-10-07 ENCOUNTER — Encounter: Payer: Self-pay | Admitting: Emergency Medicine

## 2014-10-07 ENCOUNTER — Observation Stay
Admission: EM | Admit: 2014-10-07 | Discharge: 2014-10-10 | Disposition: A | Discharge status: Skilled Nursing Facility | Payer: Medicare Other | Attending: Orthopedic Surgery | Admitting: Orthopedic Surgery

## 2014-10-07 DIAGNOSIS — Z880 Allergy status to penicillin: Secondary | ICD-10-CM | POA: Diagnosis not present

## 2014-10-07 DIAGNOSIS — K219 Gastro-esophageal reflux disease without esophagitis: Secondary | ICD-10-CM | POA: Insufficient documentation

## 2014-10-07 DIAGNOSIS — Z887 Allergy status to serum and vaccine status: Secondary | ICD-10-CM | POA: Diagnosis not present

## 2014-10-07 DIAGNOSIS — S5290XA Unspecified fracture of unspecified forearm, initial encounter for closed fracture: Secondary | ICD-10-CM | POA: Diagnosis present

## 2014-10-07 DIAGNOSIS — Z01811 Encounter for preprocedural respiratory examination: Secondary | ICD-10-CM | POA: Diagnosis present

## 2014-10-07 DIAGNOSIS — I739 Peripheral vascular disease, unspecified: Secondary | ICD-10-CM | POA: Diagnosis not present

## 2014-10-07 DIAGNOSIS — I495 Sick sinus syndrome: Secondary | ICD-10-CM | POA: Diagnosis not present

## 2014-10-07 DIAGNOSIS — I2510 Atherosclerotic heart disease of native coronary artery without angina pectoris: Secondary | ICD-10-CM | POA: Insufficient documentation

## 2014-10-07 DIAGNOSIS — I4891 Unspecified atrial fibrillation: Secondary | ICD-10-CM | POA: Insufficient documentation

## 2014-10-07 DIAGNOSIS — S52501A Unspecified fracture of the lower end of right radius, initial encounter for closed fracture: Principal | ICD-10-CM | POA: Insufficient documentation

## 2014-10-07 DIAGNOSIS — F0390 Unspecified dementia without behavioral disturbance: Secondary | ICD-10-CM | POA: Insufficient documentation

## 2014-10-07 DIAGNOSIS — E785 Hyperlipidemia, unspecified: Secondary | ICD-10-CM | POA: Insufficient documentation

## 2014-10-07 DIAGNOSIS — E039 Hypothyroidism, unspecified: Secondary | ICD-10-CM | POA: Diagnosis not present

## 2014-10-07 DIAGNOSIS — I10 Essential (primary) hypertension: Secondary | ICD-10-CM | POA: Insufficient documentation

## 2014-10-07 DIAGNOSIS — S5291XB Unspecified fracture of right forearm, initial encounter for open fracture type I or II: Secondary | ICD-10-CM | POA: Diagnosis present

## 2014-10-07 DIAGNOSIS — Z950 Presence of cardiac pacemaker: Secondary | ICD-10-CM | POA: Insufficient documentation

## 2014-10-07 DIAGNOSIS — M79631 Pain in right forearm: Secondary | ICD-10-CM | POA: Diagnosis not present

## 2014-10-07 DIAGNOSIS — S51811A Laceration without foreign body of right forearm, initial encounter: Secondary | ICD-10-CM | POA: Diagnosis not present

## 2014-10-07 DIAGNOSIS — S52201B Unspecified fracture of shaft of right ulna, initial encounter for open fracture type I or II: Secondary | ICD-10-CM

## 2014-10-07 DIAGNOSIS — R079 Chest pain, unspecified: Secondary | ICD-10-CM | POA: Diagnosis not present

## 2014-10-07 DIAGNOSIS — S52601A Unspecified fracture of lower end of right ulna, initial encounter for closed fracture: Secondary | ICD-10-CM | POA: Diagnosis not present

## 2014-10-07 DIAGNOSIS — Z7982 Long term (current) use of aspirin: Secondary | ICD-10-CM | POA: Diagnosis not present

## 2014-10-07 DIAGNOSIS — W19XXXA Unspecified fall, initial encounter: Secondary | ICD-10-CM | POA: Diagnosis not present

## 2014-10-07 DIAGNOSIS — Z885 Allergy status to narcotic agent status: Secondary | ICD-10-CM | POA: Insufficient documentation

## 2014-10-07 DIAGNOSIS — Z951 Presence of aortocoronary bypass graft: Secondary | ICD-10-CM | POA: Diagnosis not present

## 2014-10-07 DIAGNOSIS — M25531 Pain in right wrist: Secondary | ICD-10-CM | POA: Insufficient documentation

## 2014-10-07 HISTORY — DX: Atherosclerotic heart disease of native coronary artery without angina pectoris: I25.10

## 2014-10-07 HISTORY — DX: Gastro-esophageal reflux disease without esophagitis: K21.9

## 2014-10-07 HISTORY — DX: Essential (primary) hypertension: I10

## 2014-10-07 HISTORY — DX: Hyperlipidemia, unspecified: E78.5

## 2014-10-07 HISTORY — DX: Unspecified atrial fibrillation: I48.91

## 2014-10-07 HISTORY — DX: Hypothyroidism, unspecified: E03.9

## 2014-10-07 HISTORY — DX: Presence of cardiac pacemaker: Z95.0

## 2014-10-07 LAB — CBC
HCT: 37.8 % (ref 35.0–47.0)
HEMOGLOBIN: 12.6 g/dL (ref 12.0–16.0)
MCH: 30.5 pg (ref 26.0–34.0)
MCHC: 33.2 g/dL (ref 32.0–36.0)
MCV: 91.8 fL (ref 80.0–100.0)
PLATELETS: 255 10*3/uL (ref 150–440)
RBC: 4.11 MIL/uL (ref 3.80–5.20)
RDW: 14.4 % (ref 11.5–14.5)
WBC: 9.7 10*3/uL (ref 3.6–11.0)

## 2014-10-07 MED ORDER — ONDANSETRON HCL 4 MG/2ML IJ SOLN
4.0000 mg | Freq: Once | INTRAMUSCULAR | Status: AC
  Administered 2014-10-07: 4 mg via INTRAVENOUS
  Filled 2014-10-07: qty 2

## 2014-10-07 MED ORDER — MORPHINE SULFATE 4 MG/ML IJ SOLN
4.0000 mg | Freq: Once | INTRAMUSCULAR | Status: AC
  Administered 2014-10-07: 4 mg via INTRAVENOUS
  Filled 2014-10-07: qty 1

## 2014-10-07 NOTE — ED Notes (Signed)
Patient transported to X-ray 

## 2014-10-07 NOTE — ED Provider Notes (Signed)
[facility] Emergency Department Provider Note  ____________________________________________  Time seen: Approximately 11:19 PM  I have reviewed the triage vital signs and the nursing notes.   HISTORY  Chief Complaint Fall and Arm Injury    HPI Rose Gates is a 81 y.o. female who fell getting ready for bed tonight. The patient lives home with her husband and also has a history of dementia. The patient reports that she does not remember how she fell but her family member reports that she sometimes has had falls. The patient possibly hit her head but is unsure if she did so. The patient denies any head pain.The patient does have some right forearm deformity with a concern for broken arm so she was brought in for further evaluation. She rates her pain a 10 out of 10 in intensity   Past Medical History  Diagnosis Date  . Hypertension   . Coronary artery disease   . HLD (hyperlipidemia)   . A-fib   . Pacemaker     Patient Active Problem List   Diagnosis Date Noted  . Forearm fracture 10/08/2014    Past Surgical History  Procedure Laterality Date  . Cardiac surgery    . Pacemaker insertion    . Cholecystectomy    . Abdominal hysterectomy      Current Outpatient Rx  Name  Route  Sig  Dispense  Refill  . amLODipine (NORVASC) 5 MG tablet   Oral   Take 5 mg by mouth daily.         Marland Kitchen aspirin 81 MG tablet   Oral   Take 81 mg by mouth daily.         Marland Kitchen levothyroxine (SYNTHROID, LEVOTHROID) 25 MCG tablet   Oral   Take 25 mcg by mouth daily before breakfast.         . lisinopril (PRINIVIL,ZESTRIL) 5 MG tablet   Oral   Take 10 mg by mouth daily.         . sertraline (ZOLOFT) 25 MG tablet   Oral   Take 25 mg by mouth daily.           Allergies Review of patient's allergies indicates no known allergies.  No family history on file.  Social History History  Substance Use Topics  . Smoking status: Never Smoker   .  Smokeless tobacco: Not on file  . Alcohol Use: No    Review of Systems Constitutional: No fever/chills Eyes: No visual changes. ENT: No sore throat. Cardiovascular: Denies chest pain. Respiratory: Denies shortness of breath. Gastrointestinal: No abdominal pain.  No nausea, no vomiting.  No diarrhea.  No constipation. Genitourinary: Negative for dysuria. Musculoskeletal: Right forearm pain Skin: Negative for rash. Neurological: Negative for headaches, focal weakness or numbness.  10-point ROS otherwise negative.  ____________________________________________   PHYSICAL EXAM:  VITAL SIGNS: ED Triage Vitals  Enc Vitals Group     BP 10/07/14 2309 129/75 mmHg     Pulse Rate 10/07/14 2309 73     Resp --      Temp --      Temp src --      SpO2 10/07/14 2309 97 %     Weight 10/07/14 2309 105 lb (47.628 kg)     Height 10/07/14 2309  (1.575 m)     Head Cir --      Peak Flow --      Pain Score --      Pain Loc --  Pain Edu? --      Excl. in GC? --     Constitutional: Alert and oriented. Well appearing and in moderate distress. Eyes: Conjunctivae are normal. PERRL. EOMI. Head: Atraumatic. Nose: No congestion/rhinnorhea. Mouth/Throat: Mucous membranes are moist.  Oropharynx non-erythematous. Cardiovascular: Normal rate, regular rhythm. Grossly normal heart sounds.  Good peripheral circulation. Respiratory: Normal respiratory effort.  No retractions. Lungs CTAB. Gastrointestinal: Soft and nontender. No distention. Positive bowel sounds Genitourinary: Deferred Musculoskeletal: Deformity to right forearm, good pulses motion and color intact on right hand Neurologic:  Normal speech and language.  Skin:  Skin is warm, dry and intact. No rash noted. Psychiatric: Mood and affect are normal.   ____________________________________________   LABS (all labs ordered are listed, but only abnormal results are displayed)  Labs Reviewed  COMPREHENSIVE METABOLIC PANEL -  Abnormal; Notable for the following:    BUN 32 (*)    Albumin 3.4 (*)    ALT 9 (*)    Total Bilirubin 0.2 (*)    GFR calc non Af Amer 53 (*)    All other components within normal limits  CBC  TROPONIN I  PROTIME-INR  APTT   ____________________________________________  EKG  ED ECG REPORT I, Rebecka Apley, the attending physician, personally viewed and interpreted this ECG.   Date: 10/08/2014  EKG Time: 0018  Rate: 62  Rhythm: unchanged from previous tracings, ventricular paced rhythm  Axis: Left axis deviation  Intervals:left bundle branch block and Ventricular paced rhythm  ST&T Change: Flipped T waves in leads 1, aVL consistent with ventricular paced rhythm.  ____________________________________________  RADIOLOGY  CT head: No evidence of traumatic intracranial injury or fracture, moderate cortical volume loss and scattered small vessel ischemic microangiopathy. Chronic small lacunar infarcts at the basal ganglia bilaterally Right forearm x-ray: Mildly comminuted fractures involving the distal radial and ulnar diaphysis with mild volar angulation of both fractures and mild ulnar angulation of the ulnar fracture. Fractures are somewhat oblique in nature and there may be extension of the radial fracture to the radio carpal joint ____________________________________________   PROCEDURES  Procedure(s) performed: Please, see procedure note(s).  SPLINT APPLICATION Date/Time: 2:00 AM Authorized by: Lucrezia Europe P Consent: Verbal consent obtained. Risks and benefits: risks, benefits and alternatives were discussed Consent given by: patient Splint applied by: ED technician Location details: Right forearm  Splint type: Sugar tong  Supplies used: Ortho-Glass  Post-procedure: The splinted body part was neurovascularly unchanged following the procedure. Patient tolerance: Patient tolerated the procedure well with no immediate complications.     Critical Care  performed: No  ____________________________________________   INITIAL IMPRESSION / ASSESSMENT AND PLAN / ED COURSE  Pertinent labs & imaging results that were available during my care of the patient were reviewed by me and considered in my medical decision making (see chart for details).  This is a 81 year old female who fell this evening and has a deformity of her right forearm. The patient will receive some blood work, some morphine and Zofran as well as an x-ray of her arm and a CT of her head. I will reassess the patient once at the receive the results of her CT and x-ray.  Once I received the results of the x-ray of the patient's arm I did contact Dr. Martha Clan. The patient does have a laceration that is approximately 3-4 cm at the volar side of her forearm with a concern for a possible open fracture. I did not feel any sharp or firm debris within  the wound. I did approximate the wound edges using Steri-Strips as the patient will need to have it washed out. The patient received a dose of Kefzol and be admitted to the orthopedic surgery service. ____________________________________________   FINAL CLINICAL IMPRESSION(S) / ED DIAGNOSES  Final diagnoses:  Radius fracture, right, open type I or II, initial encounter  Ulna fracture, right, open type I or II, initial encounter      Rebecka Apley, MD 10/08/14 [phone]

## 2014-10-07 NOTE — ED Notes (Signed)
EMS states patient fell at home.  Presumably while getting ready for bed.  Approximate time of fall 2215.  Per EMS patient has obvious deformity to right upper extremity and open fracture.  50 mcg of fentanyl given to patient  IV by EMS PTA.

## 2014-10-08 ENCOUNTER — Observation Stay: Payer: Medicare Other | Admitting: Registered Nurse

## 2014-10-08 ENCOUNTER — Observation Stay: Payer: Medicare Other

## 2014-10-08 ENCOUNTER — Encounter
Admission: EM | Disposition: A | Discharge status: Skilled Nursing Facility | Payer: Self-pay | Source: Home / Self Care | Attending: Emergency Medicine

## 2014-10-08 ENCOUNTER — Encounter: Payer: Self-pay | Admitting: Internal Medicine

## 2014-10-08 DIAGNOSIS — S5290XA Unspecified fracture of unspecified forearm, initial encounter for closed fracture: Secondary | ICD-10-CM | POA: Diagnosis present

## 2014-10-08 HISTORY — PX: CLOSED REDUCTION RADIAL SHAFT: SHX5008

## 2014-10-08 HISTORY — PX: I&D EXTREMITY: SHX5045

## 2014-10-08 LAB — COMPREHENSIVE METABOLIC PANEL
ALBUMIN: 3.4 g/dL — AB (ref 3.5–5.0)
ALT: 9 U/L — ABNORMAL LOW (ref 14–54)
ANION GAP: 9 (ref 5–15)
AST: 18 U/L (ref 15–41)
Alkaline Phosphatase: 76 U/L (ref 38–126)
BUN: 32 mg/dL — AB (ref 6–20)
CALCIUM: 8.9 mg/dL (ref 8.9–10.3)
CHLORIDE: 106 mmol/L (ref 101–111)
CO2: 24 mmol/L (ref 22–32)
Creatinine, Ser: 0.91 mg/dL (ref 0.44–1.00)
GFR, EST NON AFRICAN AMERICAN: 53 mL/min — AB (ref >60)
Glucose, Bld: 94 mg/dL (ref 65–99)
POTASSIUM: 4.4 mmol/L (ref 3.5–5.1)
Sodium: 139 mmol/L (ref 135–145)
Total Bilirubin: 0.2 mg/dL — ABNORMAL LOW (ref 0.3–1.2)
Total Protein: 6.7 g/dL (ref 6.5–8.1)

## 2014-10-08 LAB — APTT: aPTT: 28 seconds (ref 24–36)

## 2014-10-08 LAB — TROPONIN I: Troponin I: <0.03 ng/mL (ref <0.031)

## 2014-10-08 LAB — PROTIME-INR
INR: 1.1
Prothrombin Time: 14.4 seconds (ref 11.4–15.0)

## 2014-10-08 LAB — SURGICAL PCR SCREEN
MRSA, PCR: NEGATIVE
Staphylococcus aureus: NEGATIVE

## 2014-10-08 SURGERY — IRRIGATION AND DEBRIDEMENT EXTREMITY
Anesthesia: General | Laterality: Right

## 2014-10-08 MED ORDER — FENTANYL CITRATE (PF) 100 MCG/2ML IJ SOLN
INTRAMUSCULAR | Status: DC | PRN
  Administered 2014-10-08 (×2): 25 ug via INTRAVENOUS
  Administered 2014-10-08: 50 ug via INTRAVENOUS

## 2014-10-08 MED ORDER — ACETAMINOPHEN 650 MG RE SUPP: 650.0000 mg | Freq: Four times a day (QID) | RECTAL | Status: DC | PRN

## 2014-10-08 MED ORDER — ONDANSETRON HCL 4 MG/2ML IJ SOLN
4.0000 mg | Freq: Four times a day (QID) | INTRAMUSCULAR | Status: DC | PRN
  Administered 2014-10-08 – 2014-10-10 (×2): 4 mg via INTRAVENOUS
  Filled 2014-10-08 (×2): qty 2

## 2014-10-08 MED ORDER — CEFAZOLIN SODIUM 1-5 GM-% IV SOLN: 1.0000 g | Freq: Three times a day (TID) | INTRAVENOUS | Status: DC

## 2014-10-08 MED ORDER — LEVOTHYROXINE SODIUM 25 MCG PO TABS
25.0000 ug | ORAL_TABLET | Freq: Every day | ORAL | Status: DC
  Administered 2014-10-09 – 2014-10-10 (×2): 25 ug via ORAL
  Filled 2014-10-08 (×2): qty 1

## 2014-10-08 MED ORDER — PROPOFOL 10 MG/ML IV BOLUS
INTRAVENOUS | Status: DC | PRN
  Administered 2014-10-08: 100 mg via INTRAVENOUS

## 2014-10-08 MED ORDER — DOCUSATE SODIUM 100 MG PO CAPS
100.0000 mg | ORAL_CAPSULE | Freq: Two times a day (BID) | ORAL | Status: DC
  Administered 2014-10-08 – 2014-10-10 (×4): 100 mg via ORAL
  Filled 2014-10-08 (×5): qty 1

## 2014-10-08 MED ORDER — MORPHINE SULFATE 2 MG/ML IJ SOLN
2.0000 mg | INTRAMUSCULAR | Status: DC | PRN
  Administered 2014-10-08: 2 mg via INTRAVENOUS
  Filled 2014-10-08: qty 1

## 2014-10-08 MED ORDER — BISACODYL 10 MG RE SUPP
10.0000 mg | Freq: Every day | RECTAL | Status: DC | PRN
  Administered 2014-10-10: 10 mg via RECTAL
  Filled 2014-10-08: qty 1

## 2014-10-08 MED ORDER — MORPHINE SULFATE 4 MG/ML IJ SOLN: 4.0000 mg | Freq: Once | INTRAMUSCULAR | Status: DC

## 2014-10-08 MED ORDER — AMLODIPINE BESYLATE 5 MG PO TABS
5.0000 mg | ORAL_TABLET | Freq: Every day | ORAL | Status: DC
  Administered 2014-10-08 – 2014-10-10 (×3): 5 mg via ORAL
  Filled 2014-10-08 (×3): qty 1

## 2014-10-08 MED ORDER — HYDROCODONE-ACETAMINOPHEN 5-325 MG PO TABS
1.0000 | ORAL_TABLET | ORAL | Status: DC | PRN
Start: 2014-10-08 — End: 2014-10-10
  Administered 2014-10-08 – 2014-10-09 (×4): 1 via ORAL
  Filled 2014-10-08 (×4): qty 1

## 2014-10-08 MED ORDER — ONDANSETRON HCL 4 MG/2ML IJ SOLN
4.0000 mg | Freq: Once | INTRAMUSCULAR | Status: AC
  Administered 2014-10-08: 4 mg via INTRAVENOUS
  Filled 2014-10-08: qty 2

## 2014-10-08 MED ORDER — CEFAZOLIN SODIUM 1-5 GM-% IV SOLN
1.0000 g | Freq: Once | INTRAVENOUS | Status: AC
  Administered 2014-10-08: 1 g via INTRAVENOUS
  Filled 2014-10-08: qty 50

## 2014-10-08 MED ORDER — MAGNESIUM HYDROXIDE 400 MG/5ML PO SUSP: 30.0000 mL | Freq: Every day | ORAL | Status: DC | PRN

## 2014-10-08 MED ORDER — SERTRALINE HCL 25 MG PO TABS
25.0000 mg | ORAL_TABLET | Freq: Every day | ORAL | Status: DC
  Administered 2014-10-08 – 2014-10-10 (×3): 25 mg via ORAL
  Filled 2014-10-08 (×3): qty 1

## 2014-10-08 MED ORDER — SODIUM CHLORIDE 0.9 % IV SOLN
INTRAVENOUS | Status: DC
  Administered 2014-10-08 – 2014-10-09 (×3): via INTRAVENOUS

## 2014-10-08 MED ORDER — ONDANSETRON HCL 4 MG PO TABS: 4.0000 mg | ORAL_TABLET | Freq: Four times a day (QID) | ORAL | Status: DC | PRN

## 2014-10-08 MED ORDER — ACETAMINOPHEN 325 MG PO TABS
650.0000 mg | ORAL_TABLET | Freq: Four times a day (QID) | ORAL | Status: DC | PRN
  Administered 2014-10-10: 650 mg via ORAL
  Filled 2014-10-08: qty 2

## 2014-10-08 MED ORDER — MIDAZOLAM HCL 2 MG/2ML IJ SOLN
INTRAMUSCULAR | Status: DC | PRN
  Administered 2014-10-08: 2 mg via INTRAVENOUS

## 2014-10-08 MED ORDER — NEOMYCIN-POLYMYXIN B GU 40-200000 IR SOLN
Status: DC | PRN
  Administered 2014-10-08: 4 mL

## 2014-10-08 MED ORDER — TETANUS-DIPHTH-ACELL PERTUSSIS 5-2.5-18.5 LF-MCG/0.5 IM SUSP
INTRAMUSCULAR | Status: AC
Start: 2014-10-08 — End: 2014-10-08
  Administered 2014-10-08: 0.5 mL via INTRAMUSCULAR
  Filled 2014-10-08: qty 0.5

## 2014-10-08 MED ORDER — LISINOPRIL 10 MG PO TABS
10.0000 mg | ORAL_TABLET | Freq: Every day | ORAL | Status: DC
Start: 2014-10-08 — End: 2014-10-10
  Administered 2014-10-08 – 2014-10-10 (×3): 10 mg via ORAL
  Filled 2014-10-08 (×3): qty 1

## 2014-10-08 MED ORDER — ASPIRIN 81 MG PO CHEW
81.0000 mg | CHEWABLE_TABLET | Freq: Every day | ORAL | Status: DC
  Administered 2014-10-08 – 2014-10-10 (×3): 81 mg via ORAL
  Filled 2014-10-08 (×3): qty 1

## 2014-10-08 MED ORDER — ONDANSETRON HCL 4 MG/2ML IJ SOLN: 4.0000 mg | Freq: Once | INTRAMUSCULAR | Status: DC | PRN

## 2014-10-08 MED ORDER — CEFAZOLIN SODIUM 1-5 GM-% IV SOLN
1.0000 g | Freq: Four times a day (QID) | INTRAVENOUS | Status: AC
  Administered 2014-10-08 (×2): 1 g via INTRAVENOUS
  Filled 2014-10-08 (×2): qty 50

## 2014-10-08 MED ORDER — CEFAZOLIN SODIUM 1-5 GM-% IV SOLN
1.0000 g | Freq: Four times a day (QID) | INTRAVENOUS | Status: DC
  Filled 2014-10-08 (×3): qty 50

## 2014-10-08 MED ORDER — ONDANSETRON HCL 4 MG/2ML IJ SOLN
INTRAMUSCULAR | Status: DC | PRN
  Administered 2014-10-08: 4 mg via INTRAVENOUS

## 2014-10-08 MED ORDER — CEFAZOLIN SODIUM 1-5 GM-% IV SOLN
INTRAVENOUS | Status: DC | PRN
  Administered 2014-10-08: 1 g via INTRAVENOUS

## 2014-10-08 MED ORDER — FENTANYL CITRATE (PF) 100 MCG/2ML IJ SOLN
50.0000 ug | Freq: Once | INTRAMUSCULAR | Status: AC
  Administered 2014-10-08: 50 ug via INTRAVENOUS
  Filled 2014-10-08: qty 2

## 2014-10-08 MED ORDER — LACTATED RINGERS IV SOLN
INTRAVENOUS | Status: DC | PRN
  Administered 2014-10-08: 08:00:00 via INTRAVENOUS

## 2014-10-08 MED ORDER — FENTANYL CITRATE (PF) 100 MCG/2ML IJ SOLN: 25.0000 ug | INTRAMUSCULAR | Status: DC | PRN

## 2014-10-08 MED ORDER — TETANUS-DIPHTH-ACELL PERTUSSIS 5-2.5-18.5 LF-MCG/0.5 IM SUSP
0.5000 mL | Freq: Once | INTRAMUSCULAR | Status: AC
  Administered 2014-10-08: 0.5 mL via INTRAMUSCULAR

## 2014-10-08 MED ORDER — LIDOCAINE HCL (CARDIAC) 20 MG/ML IV SOLN
INTRAVENOUS | Status: DC | PRN
  Administered 2014-10-08: 50 mg via INTRAVENOUS

## 2014-10-08 MED ORDER — MAGNESIUM CITRATE PO SOLN: 1.0000 | Freq: Once | ORAL | Status: AC | PRN

## 2014-10-08 SURGICAL SUPPLY — 34 items
BANDAGE ELASTIC 3 CLIP NS LF (GAUZE/BANDAGES/DRESSINGS) IMPLANT
BANDAGE ELASTIC 4 CLIP NS LF (GAUZE/BANDAGES/DRESSINGS) IMPLANT
BLADE SURG MINI STRL (BLADE) IMPLANT
BNDG ESMARK 4X12 TAN STRL LF (GAUZE/BANDAGES/DRESSINGS) IMPLANT
CANISTER SUCT 1200ML W/VALVE (MISCELLANEOUS) IMPLANT
CANISTER SUCT 3000ML PPV (MISCELLANEOUS) ×3 IMPLANT
CAST PADDING 3X4FT ST 30246 (SOFTGOODS) ×6
CASTING MATERIAL DELTA LITE (CAST SUPPLIES) ×6 IMPLANT
CHLORAPREP W/TINT 26ML (MISCELLANEOUS) IMPLANT
CLOSURE WOUND 1/2 X4 (GAUZE/BANDAGES/DRESSINGS) ×2
DRAPE SURG 17X11 SM STRL (DRAPES) IMPLANT
DRAPE U-SHAPE 47X51 STRL (DRAPES) IMPLANT
GAUZE PETRO XEROFOAM 1X8 (MISCELLANEOUS) ×3 IMPLANT
GAUZE SPONGE 4X4 12PLY STRL (GAUZE/BANDAGES/DRESSINGS) ×3 IMPLANT
GAUZE XEROFORM 4X4 STRL (GAUZE/BANDAGES/DRESSINGS) ×3 IMPLANT
GLOVE BIOGEL PI IND STRL 9 (GLOVE) ×2 IMPLANT
GLOVE BIOGEL PI INDICATOR 9 (GLOVE) ×4
GLOVE SURG 9.0 ORTHO LTXF (GLOVE) ×6 IMPLANT
GOWN STRL REUS TWL 2XL XL LVL4 (GOWN DISPOSABLE) ×3 IMPLANT
GOWN STRL REUS W/ TWL LRG LVL3 (GOWN DISPOSABLE) ×1 IMPLANT
GOWN STRL REUS W/TWL LRG LVL3 (GOWN DISPOSABLE) ×2
NS IRRIG 500ML POUR BTL (IV SOLUTION) ×6 IMPLANT
PACK EXTREMITY ARMC (MISCELLANEOUS) ×3 IMPLANT
PAD CAST CTTN 3X4 STRL (SOFTGOODS) ×3 IMPLANT
PAD CAST CTTN 4X4 STRL (SOFTGOODS) IMPLANT
PAD GROUND ADULT SPLIT (MISCELLANEOUS) ×3 IMPLANT
PAD PREP 24X41 OB/GYN DISP (PERSONAL CARE ITEMS) ×3 IMPLANT
PADDING CAST COTTON 4X4 STRL (SOFTGOODS)
SLING ARM M TX990204 (SOFTGOODS) ×3 IMPLANT
SPLINT CAST 1 STEP 4X15 (MISCELLANEOUS) IMPLANT
STOCKINETTE STRL 4IN 9604848 (GAUZE/BANDAGES/DRESSINGS) ×3 IMPLANT
STRAP SAFETY BODY (MISCELLANEOUS) ×3 IMPLANT
STRIP CLOSURE SKIN 1/2X4 (GAUZE/BANDAGES/DRESSINGS) ×4 IMPLANT
SUT ETHILON 4 0 P 3 18 (SUTURE) ×6 IMPLANT

## 2014-10-08 NOTE — Consult Note (Signed)
[facility] - [place] at [facility]   PATIENT NAME: Rose Gates    MR#:  [id number]  DATE OF BIRTH:  03-26-1919  DATE OF ADMISSION:  10/07/2014  PRIMARY CARE PHYSICIAN: Jaclyn Shaggy, MD   REQUESTING/REFERRING PHYSICIAN: Real Cons, MD  CHIEF COMPLAINT:   Chief Complaint  Patient presents with  . Fall  . Arm Injury    HISTORY OF PRESENT ILLNESS:  Rose Gates  is a 81 y.o. female who presents with a fall and subsequent right forearm fracture. She was seen by orthopedics in the ED and is potentially being taken tomorrow to the OR for washout and surgical intervention. Orthopedics requested a hospitalist consult for preoperative cardiac risk stratification. Patient has no history of congestive heart failure, stroke or diabetes. Creatinine is normal on evaluation in the ED, and there are no pathological Q waves on her EKG. Planned surgery is not high risk type surgery. She does have a history of coronary artery disease with prior bypass surgery, as well as sick sinus syndrome with pacemaker implant. According to her daughter who is with her in the ED today Dr. Gwen Pounds, her cardiologist, saw her this past week and checked her pacemaker and said that everything was functioning well.  PAST MEDICAL HISTORY:   Past Medical History  Diagnosis Date  . Hypertension   . Coronary artery disease   . HLD (hyperlipidemia)   . A-fib   . Pacemaker   . Hypothyroidism   . GERD (gastroesophageal reflux disease)     PAST SURGICAL HISTOIRY:   Past Surgical History  Procedure Laterality Date  . Cardiac surgery    . Pacemaker insertion    . Cholecystectomy    . Abdominal hysterectomy      SOCIAL HISTORY:   History  Substance Use Topics  . Smoking status: Never Smoker   . Smokeless tobacco: Not on file  . Alcohol Use: No    FAMILY HISTORY:   Family History  Problem Relation Age of Onset  . Heart attack Mother   . Stroke Mother   . Rheum  arthritis Father   . Rheum arthritis Sister     #1  . Rheum arthritis Sister     #2    DRUG ALLERGIES:   Allergies  Allergen Reactions  . Codeine   . Penicillins   . Sulfa Antibiotics     REVIEW OF SYSTEMS:  Review of Systems  Constitutional: Negative for fever, chills, weight loss and malaise/fatigue.  HENT: Positive for hearing loss (chronic and stable). Negative for ear pain and tinnitus.   Eyes: Negative for blurred vision, double vision, pain and redness.  Respiratory: Negative for cough, hemoptysis and shortness of breath.   Cardiovascular: Negative for chest pain, palpitations, orthopnea and leg swelling.  Gastrointestinal: Negative for nausea, vomiting, abdominal pain, diarrhea and constipation.  Genitourinary: Negative for dysuria, frequency and hematuria.  Musculoskeletal: Positive for falls. Negative for back pain and neck pain.       Right arm pain  Skin:       No acne, rash, or lesions  Neurological: Negative for dizziness, tremors, focal weakness and weakness.  Endo/Heme/Allergies: Negative for polydipsia. Does not bruise/bleed easily.  Psychiatric/Behavioral: Negative for depression. The patient is not nervous/anxious and does not have insomnia.     MEDICATIONS AT HOME:   Prior to Admission medications   Medication Sig Start Date End Date Taking? Authorizing Provider  amLODipine (NORVASC) 5 MG tablet Take 5 mg by mouth daily.   Yes  Historical Provider, MD  aspirin 81 MG tablet Take 81 mg by mouth daily.   Yes Historical Provider, MD  levothyroxine (SYNTHROID, LEVOTHROID) 25 MCG tablet Take 25 mcg by mouth daily before breakfast.   Yes Historical Provider, MD  lisinopril (PRINIVIL,ZESTRIL) 5 MG tablet Take 10 mg by mouth daily.   Yes Historical Provider, MD  sertraline (ZOLOFT) 25 MG tablet Take 25 mg by mouth daily.   Yes Historical Provider, MD      VITAL SIGNS:   Filed Vitals:   10/07/14 2309 10/07/14 2330 10/08/14 0248 10/08/14 0300  BP: 129/75  130/65 139/76 133/60  Pulse: 73 67 63 57  Resp:   18   Height:  (1.575 m)     Weight: 47.628 kg (105 lb)     SpO2: 97% 100% 96% 94%   Wt Readings from Last 3 Encounters:  10/07/14 47.628 kg (105 lb)    PHYSICAL EXAMINATION:  Physical Exam  Vitals reviewed. Constitutional: She is oriented to person, place, and time. She appears well-developed and well-nourished. No distress.  HENT:  Head: Normocephalic and atraumatic.  Mouth/Throat: Oropharynx is clear and moist.  Hard of hearing  Eyes: Conjunctivae and EOM are normal. Pupils are equal, round, and reactive to light. No scleral icterus.  Neck: Normal range of motion. Neck supple. No JVD present. No thyromegaly present.  Cardiovascular: Normal rate, regular rhythm and intact distal pulses.  Exam reveals no gallop and no friction rub.   No murmur heard. Respiratory: Effort normal and breath sounds normal. No respiratory distress. She has no wheezes. She has no rales.  GI: Soft. Bowel sounds are normal. She exhibits no distension. There is no tenderness.  Musculoskeletal: Normal range of motion. She exhibits no edema.  Right arm is splinted  Lymphadenopathy:    She has no cervical adenopathy.  Neurological: She is alert and oriented to person, place, and time. No cranial nerve deficit.  No dysarthria, no aphasia  Skin: Skin is warm and dry. No rash noted. No erythema.  Psychiatric: She has a normal mood and affect. Her behavior is normal. Judgment and thought content normal.     LABORATORY PANEL:   CBC  Recent Labs Lab 10/07/14 2325  WBC 9.7  HGB 12.6  HCT 37.8  PLT 255   ------------------------------------------------------------------------------------------------------------------  Chemistries   Recent Labs Lab 10/07/14 2325  NA 139  K 4.4  CL 106  CO2 24  GLUCOSE 94  BUN 32*  CREATININE 0.91  CALCIUM 8.9  AST 18  ALT 9*  ALKPHOS 76  BILITOT 0.2*    ------------------------------------------------------------------------------------------------------------------  Cardiac Enzymes  Recent Labs Lab 10/07/14 2325  TROPONINI <0.03   ------------------------------------------------------------------------------------------------------------------  RADIOLOGY:  Dg Forearm Right  10/08/2014   CLINICAL DATA:  Acute onset of right forearm pain after fall. Initial encounter.  EXAM: RIGHT FOREARM - 2 VIEW  COMPARISON:  None.  FINDINGS: There are mildly comminuted fractures involving the distal radial and ulnar diaphyses, with mild volar angulation of both fractures, and mild ulnar angulation of the ulnar fracture. The fractures are somewhat oblique in nature, and there may be extension of the radial fracture to the radiocarpal joint.  The carpal rows appear grossly intact, and demonstrate normal alignment. Soft tissue swelling is noted about the fracture sites. The elbow joint is incompletely assessed, but appears grossly unremarkable.  IMPRESSION: Mildly comminuted fractures involving the distal radial and ulnar diaphyses, with mild volar angulation of both fractures, and mild ulnar angulation of the ulnar fracture. The  fractures are somewhat oblique in nature, and there may be extension of the radial fracture to the radiocarpal joint.   Electronically Signed   By: Roanna Raider M.D.   On: 10/08/2014 00:44   Ct Head Wo Contrast  10/08/2014   CLINICAL DATA:  Status post fall at home. Concern for head injury. Initial encounter.  EXAM: CT HEAD WITHOUT CONTRAST  TECHNIQUE: Contiguous axial images were obtained from the base of the skull through the vertex without intravenous contrast.  COMPARISON:  MRI of the brain performed 10/22/2006  FINDINGS: There is no evidence of acute infarction, mass lesion, or intra- or extra-axial hemorrhage on CT.  Prominence of the ventricles and sulci reflects moderate cortical volume loss. Cerebellar atrophy is noted.  Scattered periventricular and subcortical white matter change likely reflects small vessel ischemic microangiopathy. Small chronic lacunar infarcts are seen at the basal ganglia bilaterally.  The brainstem and fourth ventricle are within normal limits. The basal ganglia are unremarkable in appearance. The cerebral hemispheres demonstrate grossly normal gray-white differentiation. No mass effect or midline shift is seen.  There is no evidence of fracture; visualized osseous structures are unremarkable in appearance. The visualized portions of the orbits are within normal limits. The paranasal sinuses and mastoid air cells are well-aerated. No significant soft tissue abnormalities are seen.  IMPRESSION: 1. No evidence of traumatic intracranial injury or fracture. 2. Moderate cortical volume loss and scattered small vessel ischemic microangiopathy. 3. Small chronic lacunar infarcts at the basal ganglia bilaterally.   Electronically Signed   By: Roanna Raider M.D.   On: 10/08/2014 00:48    EKG:   Orders placed or performed during the hospital encounter of 10/07/14  . ED EKG  . ED EKG    IMPRESSION AND PLAN:  Active Problems:   Forearm fracture - plan for possible coronary intervention tomorrow by orthopedics. We are consult it for cardiac risk stratification preoperatively.  _No_ High-risk type of surgery (examples include vascular surgery and any open intraperitoneal or intrathoracic procedures)  _No_ History of ischemic heart disease (history of MI or a positive exercise test, current complaint of chest pain considered to be secondary to myocardial ischemia, use of nitrate therapy, or ECG with pathological Q waves; do not count prior coronary revascularization procedure unless one of the other criteria for ischemic heart disease is present)  _No_ History of HF  _No_ History of cerebrovascular disease  _No_ Diabetes mellitus requiring treatment with insulin  _No_ Preoperative serum creatinine >2.0  mg/dL (161 micromol/L)  Rate of cardiac death, nonfatal myocardial infarction, and nonfatal cardiac arrest according to the number of predictors No risk factors - 0.4 percent (95% CI: 0.1-0.8) One risk factor - 1.0 percent (95% CI: 0.5-1.4)  Two risk factors - 2.4 percent (95% CI: 1.3-3.5)  Three or more risk factors - 5.4 percent (95% CI: 2.8-7.9)  Rate of myocardial infarction, pulmonary edema, ventricular fibrillation, primary cardiac arrest, and complete heart block No risk factors - 0.5 percent (95% CI: 0.2-1.1)  One risk factor - 1.3 percent (95% CI: 0.7-2.1)  Two risk factors - 3.6 percent (95% CI: 2.1-5.6)  Three or more risk factors - 9.1 percent (95% CI: 5.5-13.8)  Her prior coronary bypass grafting does not count as ischemic heart disease by the guidelines for this revised cardiac risk index, though counting it as so would give the range of 0-1 risk factors, as highlighted above.  Strictly speaking, this patient has no risk factors.  Given that she has had prior coronary  bypass surgery, though no history of actual MI, and that she has had sick sinus syndrome with pacemaker implantation, if further cardiac risk stratification is desired I would recommend contacting Dr Gwen Pounds, her cardiologist.    All the records are reviewed and case discussed with ED provider. Management plans discussed with the patient and/or family.  CODE STATUS:     Code Status Orders        Start     Ordered   10/08/14 0235  Full code   Continuous     10/08/14 0243    Advance Directive Documentation        Most Recent Value   Type of Advance Directive  Healthcare Power of Attorney, Living will   Pre-existing out of facility DNR order (yellow form or pink MOST form)     "MOST" Form in Place?      Full Code  TOTAL TIME TAKING CARE OF THIS PATIENT: 35 minutes.    Quindarrius Joplin FIELDING 10/08/2014, 3:28 AM  [facility]  Office  [phone]  CC: Primary care Physician:  Jaclyn Shaggy, MD

## 2014-10-08 NOTE — Transfer of Care (Signed)
Immediate Anesthesia Transfer of Care Note  Patient: Rose Gates  Procedure(s) Performed: Procedure(s): IRRIGATION AND DEBRIDEMENT EXTREMITY (Right) CLOSED REDUCTION RADIAL SHAFT (Right)  Patient Location: PACU  Anesthesia Type:General  Level of Consciousness: awake and patient cooperative  Airway & Oxygen Therapy: Patient Spontanous Breathing  Post-op Assessment: Report given to RN  Post vital signs: stable  Last Vitals:  Filed Vitals:   10/08/14 0930  BP: 137/56  Pulse: 54  Temp: 36.4 C  Resp: 14    Complications: No apparent anesthesia complications

## 2014-10-08 NOTE — Progress Notes (Signed)
[facility] - [place] at [facility]   PATIENT NAME: Rose Gates    MR#:  [id number]  DATE OF BIRTH:  [birth year]  SUBJECTIVE:  CHIEF COMPLAINT:   Chief Complaint  Patient presents with  . Fall  . Arm Injury  s/p sx today, drowsy, no complains.  REVIEW OF SYSTEMS:   Constitutional: Negative for fever, chills, weight loss and malaise/fatigue.  HENT: Positive for hearing loss (chronic and stable). Negative for ear pain and tinnitus.  Eyes: Negative for blurred vision, double vision, pain and redness.  Respiratory: Negative for cough, hemoptysis and shortness of breath.  Cardiovascular: Negative for chest pain, palpitations, orthopnea and leg swelling.  Gastrointestinal: Negative for nausea, vomiting, abdominal pain, diarrhea and constipation.  Genitourinary: Negative for dysuria, frequency and hematuria.  Musculoskeletal: Positive for falls. Negative for back pain and neck pain.   Right arm pain  Skin:   No acne, rash, or lesions  Neurological: Negative for dizziness, tremors, focal weakness and weakness.  Endo/Heme/Allergies: Negative for polydipsia. Does not bruise/bleed easily.  Psychiatric/Behavioral: Negative for depression. The patient is not nervous/anxious and does not have insomnia.    MEDICATIONS AT HOME:         ROS  DRUG ALLERGIES:   Allergies  Allergen Reactions  . Codeine   . Penicillins   . Sulfa Antibiotics     VITALS:  Blood pressure 129/39, pulse 62, temperature 97.3 F (36.3 C), temperature source Oral, resp. rate 18, height  (1.575 m), weight 47.628 kg (105 lb), SpO2 94 %.  PHYSICAL EXAMINATION:  GENERAL:  81 y.o.-year-old patient lying in the bed with no acute distress.  Constitutional: She is oriented to person, place, and time. She appears well-developed and well-nourished. No distress.  HENT:  Head: Normocephalic and atraumatic.  Mouth/Throat: Oropharynx is clear and moist.  Hard  of hearing  Eyes: Conjunctivae and EOM are normal. Pupils are equal, round, and reactive to light. No scleral icterus.  Neck: Normal range of motion. Neck supple. No JVD present. No thyromegaly present.  Cardiovascular: Normal rate, regular rhythm and intact distal pulses. Exam reveals no gallop and no friction rub.  No murmur heard. Respiratory: Effort normal and breath sounds normal. No respiratory distress. She has no wheezes. She has no rales.  GI: Soft. Bowel sounds are normal. She exhibits no distension. There is no tenderness.  Musculoskeletal: Normal range of motion. She exhibits no edema.  Right arm is splinted  Lymphadenopathy:   She has no cervical adenopathy.  Neurological: She is alert and oriented to person, place, and time. No cranial nerve deficit.  No dysarthria, no aphasia  Skin: Skin is warm and dry. No rash noted. No erythema.  Psychiatric: She has a normal mood and affect. Her behavior is normal. Judgment and thought content normal.    Physical Exam LABORATORY PANEL:   CBC  Recent Labs Lab 10/07/14 2325  WBC 9.7  HGB 12.6  HCT 37.8  PLT 255   ------------------------------------------------------------------------------------------------------------------  Chemistries   Recent Labs Lab 10/07/14 2325  NA 139  K 4.4  CL 106  CO2 24  GLUCOSE 94  BUN 32*  CREATININE 0.91  CALCIUM 8.9  AST 18  ALT 9*  ALKPHOS 76  BILITOT 0.2*   ------------------------------------------------------------------------------------------------------------------  Cardiac Enzymes  Recent Labs Lab 10/07/14 2325  TROPONINI <0.03   ------------------------------------------------------------------------------------------------------------------  RADIOLOGY:  Dg Forearm Right  10/08/2014   CLINICAL DATA:  Acute onset of right forearm pain after fall. Initial encounter.  EXAM:  RIGHT FOREARM - 2 VIEW  COMPARISON:  None.  FINDINGS: There are mildly comminuted  fractures involving the distal radial and ulnar diaphyses, with mild volar angulation of both fractures, and mild ulnar angulation of the ulnar fracture. The fractures are somewhat oblique in nature, and there may be extension of the radial fracture to the radiocarpal joint.  The carpal rows appear grossly intact, and demonstrate normal alignment. Soft tissue swelling is noted about the fracture sites. The elbow joint is incompletely assessed, but appears grossly unremarkable.  IMPRESSION: Mildly comminuted fractures involving the distal radial and ulnar diaphyses, with mild volar angulation of both fractures, and mild ulnar angulation of the ulnar fracture. The fractures are somewhat oblique in nature, and there may be extension of the radial fracture to the radiocarpal joint.   Electronically Signed   By: Roanna Raider M.D.   On: 10/08/2014 00:44   Dg Wrist 2 Views Right  10/08/2014   CLINICAL DATA:  81 year old female with history of trauma from a fall with a fractures of the distal radius and ulna. Status post cast placement.  EXAM: RIGHT WRIST - 2 VIEW  COMPARISON:  10/07/2014.  FINDINGS: Interval placement of a plaster cast obscuring finer bony detail. Mildly comminuted fracture of the distal radial metadiaphysis and oblique fracture of the distal third of the ulnar diaphysis are again noted. There is improved alignment, which is near anatomic in the radius. There continues to be approximately 25 degrees of ulnar angulation of the distal ulnar fracture, and approximately 4 mm of ulnar displacement.  IMPRESSION: 1. Status post cast fixation for distal radial and ulnar fractures, with improved alignment, as above.   Electronically Signed   By: Trudie Reed M.D.   On: 10/08/2014 10:31   Ct Head Wo Contrast  10/08/2014   CLINICAL DATA:  Status post fall at home. Concern for head injury. Initial encounter.  EXAM: CT HEAD WITHOUT CONTRAST  TECHNIQUE: Contiguous axial images were obtained from the base of  the skull through the vertex without intravenous contrast.  COMPARISON:  MRI of the brain performed 10/22/2006  FINDINGS: There is no evidence of acute infarction, mass lesion, or intra- or extra-axial hemorrhage on CT.  Prominence of the ventricles and sulci reflects moderate cortical volume loss. Cerebellar atrophy is noted. Scattered periventricular and subcortical white matter change likely reflects small vessel ischemic microangiopathy. Small chronic lacunar infarcts are seen at the basal ganglia bilaterally.  The brainstem and fourth ventricle are within normal limits. The basal ganglia are unremarkable in appearance. The cerebral hemispheres demonstrate grossly normal gray-white differentiation. No mass effect or midline shift is seen.  There is no evidence of fracture; visualized osseous structures are unremarkable in appearance. The visualized portions of the orbits are within normal limits. The paranasal sinuses and mastoid air cells are well-aerated. No significant soft tissue abnormalities are seen.  IMPRESSION: 1. No evidence of traumatic intracranial injury or fracture. 2. Moderate cortical volume loss and scattered small vessel ischemic microangiopathy. 3. Small chronic lacunar infarcts at the basal ganglia bilaterally.   Electronically Signed   By: Roanna Raider M.D.   On: 10/08/2014 00:48   Chest Portable 1 View  10/08/2014   CLINICAL DATA:  Preop chest radiograph  EXAM: PORTABLE CHEST - 1 VIEW  COMPARISON:  09/26/2009  FINDINGS: There is mild unchanged cardiomegaly. There are intact appearances of the transvenous leads. The lungs are clear. There is no large effusion. The pulmonary vasculature is normal.  IMPRESSION: Unchanged cardiomegaly.  No  acute findings.   Electronically Signed   By: Ellery Plunk M.D.   On: 10/08/2014 03:31    ASSESSMENT AND PLAN:   * Fore arm fracture- s/p surgery   Tolerated well.  * Hx of CAD,    Cont meds  * htn   Home meds stabel  * A fib   Cont  asa, Amlodipin  * Hypothyroidism   Cont Levothyroxin  Pt can be discharged when stable from ortho. Will sigh off, as pt tolerated surgery well.  Please continue all pre-operative home meds after d/c.   All the records are reviewed and case discussed with Care Management/Social Workerr. Management plans discussed with the patient, family and they are in agreement.  CODE STATUS: full TOTAL TIME TAKING CARE OF THIS PATIENT:30 minutes.     POSSIBLE D/C IN 1-2 DAYS, DEPENDING ON CLINICAL CONDITION.   Altamese Dilling M.D on 10/08/2014   Between 7am to 6pm - Pager - [phone]  After 6pm go to [email or web address] - password [email or web address] [facility]  [place] [facility]  Office  [phone]  CC: Primary care physician; Jaclyn Shaggy, MD

## 2014-10-08 NOTE — Evaluation (Signed)
Physical Therapy Evaluation Patient Details Name: Rose Gates MRN: [id number] DOB: 10-06-1919 Today's Date: 10/08/2014   History of Present Illness  81 yo female fell in unwitnessed fall overnight and had sugery to ensure she was not protruding bone, now casted and set with NWB through R wrist and hand on platform.  Cardiomegaly  Clinical Impression  Pt was able to work with PT to attempt platform walker and then to sidestep with belt and physical assist with pt using LUE only to transition.  Her fear of mobility is likely related to recent fall but does have a reluctance to move that will make going home too much for her elderly husband.  Pt will be recommended for SNF as she is not safely able to be assisted with family yet.    Follow Up Recommendations SNF    Equipment Recommendations  Other (comment) (hemiwalker vs platform walker)    Recommendations for Other Services       Precautions / Restrictions Precautions Precautions: Fall Restrictions Weight Bearing Restrictions: Yes RUE Weight Bearing: Non weight bearing      Mobility  Bed Mobility Overal bed mobility: Needs Assistance Bed Mobility: Supine to Sit;Sit to Supine     Supine to sit: Mod assist Sit to supine: Mod assist   General bed mobility comments: help sitting up under trunk and under legs and trunk back to bed  Transfers Overall transfer level: Needs assistance Equipment used: Right platform walker;Rolling walker (2 wheeled);1 person hand held assist Transfers: Sit to/from [employer] Transfers Sit to Stand: Mod assist Stand pivot transfers: Mod assist       General transfer comment: Pt has poor vision and hearing and needs cues near her ear and with touch cues to direct standing and stepping but cannot tolerate walker yet  Ambulation/Gait             General Gait Details: transfers only  Stairs            Wheelchair Mobility    Modified Rankin (Stroke Patients Only)       Balance Overall balance assessment: Needs assistance Sitting-balance support: Feet supported Sitting balance-Leahy Scale: Fair   Postural control: Posterior lean Standing balance support: Bilateral upper extremity supported Standing balance-Leahy Scale: Poor Standing balance comment: cannot tolerate pressure under cast on RUE                             Pertinent Vitals/Pain Pain Assessment: Faces Pain Score: 8  Faces Pain Scale: Hurts whole lot Pain Location: R shoulder and wrist Pain Intervention(s): Limited activity within patient's tolerance;Monitored during session;Premedicated before session;Repositioned    Home Living Family/patient expects to be discharged to:: Skilled nursing facility Living Arrangements: Spouse/significant other               Additional Comments: children nearby    Prior Function Level of Independence: Independent with assistive device(s)         Comments: tends to leave walker behind and hold the walls     Hand Dominance        Extremity/Trunk Assessment   Upper Extremity Assessment: Generalized weakness           Lower Extremity Assessment: Generalized weakness      Cervical / Trunk Assessment: Kyphotic  Communication   Communication: HOH;Other (comment) (poor vision)  Cognition Arousal/Alertness: Lethargic Behavior During Therapy: Anxious Overall Cognitive Status: Impaired/Different from baseline Area of Impairment: Orientation;Attention;Following commands;Safety/judgement;Awareness;Problem solving;Memory Orientation  Level: Situation;Time Current Attention Level: Alternating Memory: Decreased recall of precautions;Decreased short-term memory Following Commands: Follows one step commands inconsistently Safety/Judgement: Decreased awareness of safety;Decreased awareness of deficits Awareness: Intellectual Problem Solving: Slow processing;Difficulty sequencing;Requires verbal cues      General  Comments General comments (skin integrity, edema, etc.): Pt is not practically able to use platform walker yet, but may be that it is POD 0-.  Will bring hemiwalker and compare to platform walker and recommending SNF for now.    Exercises        Assessment/Plan    PT Assessment Patient needs continued PT services  PT Diagnosis Difficulty walking;Generalized weakness   PT Problem List Decreased strength;Decreased range of motion;Decreased activity tolerance;Decreased balance;Decreased mobility;Decreased coordination;Decreased cognition;Decreased knowledge of use of DME;Decreased safety awareness;Decreased knowledge of precautions;Pain;Decreased skin integrity  PT Treatment Interventions DME instruction;Gait training;Functional mobility training;Therapeutic activities;Therapeutic exercise;Balance training;Neuromuscular re-education;Cognitive remediation;Patient/family education   PT Goals (Current goals can be found in the Care Plan section) Acute Rehab PT Goals Patient Stated Goal: to get to [facility] PT Goal Formulation: With patient/family Time For Goal Achievement: 10/22/14 Potential to Achieve Goals: Good    Frequency 7X/week   Barriers to discharge Decreased caregiver support husband is not strong enough to help her    Co-evaluation               End of Session Equipment Utilized During Treatment: Gait belt Activity Tolerance: Patient limited by pain;Patient limited by fatigue;Patient limited by lethargy Patient left: in bed;with call bell/phone within reach;with bed alarm set;with family/visitor present Nurse Communication: Mobility status;Weight bearing status    Functional Assessment Tool Used: clinical judgment Functional Limitation: Mobility: Walking and moving around Mobility: Walking and Moving Around Current Status (Z6109): At least 40 percent but less than 60 percent impaired, limited or restricted Mobility: Walking and Moving Around Goal Status [phone]): At least 40  percent but less than 60 percent impaired, limited or restricted    Time: 1330-1359 PT Time Calculation (min) (ACUTE ONLY): 29 min   Charges:   PT Evaluation $Initial PT Evaluation Tier I: 1 Procedure PT Treatments $Therapeutic Activity: 8-22 mins   PT G Codes:   PT G-Codes **NOT FOR INPATIENT CLASS** Functional Assessment Tool Used: clinical judgment Functional Limitation: Mobility: Walking and moving around Mobility: Walking and Moving Around Current Status (U9811): At least 40 percent but less than 60 percent impaired, limited or restricted Mobility: Walking and Moving Around Goal Status [phone]): At least 40 percent but less than 60 percent impaired, limited or restricted    Ivar Drape 10/08/2014, 2:58 PM   Samul Dada, PT MS Acute Rehab Dept. Number: [facility] [id number] and [facility] [phone]

## 2014-10-08 NOTE — Anesthesia Postprocedure Evaluation (Signed)
  Anesthesia Post-op Note  Patient: Rose Gates  Procedure(s) Performed: Procedure(s): IRRIGATION AND DEBRIDEMENT EXTREMITY (Right) CLOSED REDUCTION RADIAL SHAFT (Right)  Anesthesia type:General LMA  Patient location: PACU  Post pain: Pain level controlled  Post assessment: Post-op Vital signs reviewed, Patient's Cardiovascular Status Stable, Respiratory Function Stable, Patent Airway and No signs of Nausea or vomiting  Post vital signs: Reviewed and stable  Last Vitals:  Filed Vitals:   10/08/14 2027  BP: 129/39  Pulse: 62  Temp: 36.3 C  Resp: 18    Level of consciousness: awake, alert  and patient cooperative  Complications: No apparent anesthesia complications

## 2014-10-08 NOTE — Care Management Note (Signed)
Case Management Note  Patient Details  Name: Rose Gates MRN: [id number] Date of Birth: 18-Mar-1919  Subjective/Objective:      PT recommends SNF. Social Work is currently Psychologist, occupational at [employer].               Action/Plan:   Expected Discharge Date:                  Expected Discharge Plan:     In-House Referral:     Discharge planning Services     Post Acute Care Choice:    Choice offered to:     DME Arranged:    DME Agency:     HH Arranged:    HH Agency:     Status of Service:     Medicare Important Message Given:    Date Medicare IM Given:    Medicare IM give by:    Date Additional Medicare IM Given:    Additional Medicare Important Message give by:     If discussed at Long Length of Stay Meetings, dates discussed:    Additional Comments:  [name], RN 10/08/2014, 6:02 PM

## 2014-10-08 NOTE — Clinical Social Work Note (Signed)
Clinical Social Work Assessment  Patient Details  Name: Rose Gates MRN: [id number] Date of Birth: 1919/08/02  Date of referral:  10/08/14               Reason for consult:  Facility Placement                Permission sought to share information with:  Chartered certified accountant granted to share information::  Yes, Verbal Permission Granted  Name::      [place]::   [place]   Relationship::     Contact Information:     Housing/Transportation Living arrangements for the past 2 months:  [place] of Information:  Adult Children Patient Interpreter Needed:  None Criminal Activity/Legal Involvement Pertinent to Current Situation/Hospitalization:  No - Comment as needed Significant Relationships:  Adult Children, Spouse Lives with:  Spouse Do you feel safe going back to the place where you live?  Yes Need for family participation in patient care:  Yes (Comment)  Care giving concerns: Patient lives with her husband in [place].    Social Worker assessment / plan: Holiday representative ([place]) received SNF consult. Patient had surgery today. PT is recommending SNF. CSW met with patient and her son Meda Coffee was at bedside. Patient was asleep during assessment. Son reported that patient lives in [place] with her husband. CSW explained that PT is recommending SNF. Son reported that he prefers [facility]. CSW made son aware that if patient D/C's on Sunday they will have to choose a facility that will accept patient over the weekend. Son verbalized his understanding.   FL2 complete and faxed out.   Employment status:  Retired Nurse, adult PT Recommendations:  [place] / Referral to community resources:  [place]  Patient/Family's Response to care: Son is agreeable to SNF and prefers [facility].   Patient/Family's Understanding of and  Emotional Response to Diagnosis, Current Treatment, and Prognosis: Son thanked CSW for visit.   Emotional Assessment Appearance:    Attitude/Demeanor/Rapport:  Unable to Assess Affect (typically observed):  Unable to Assess Orientation:  Fluctuating Orientation (Suspected and/or reported Sundowners) Alcohol / Substance use:  Not Applicable Psych involvement (Current and /or in the community):  No (Comment)  Discharge Needs  Concerns to be addressed:  Discharge Planning Concerns Readmission within the last 30 days:  No Current discharge risk:  Dependent with Mobility Barriers to Discharge:  Continued Medical Work up   Loralyn Freshwater, LCSW 10/08/2014, 5:21 PM

## 2014-10-08 NOTE — Anesthesia Preprocedure Evaluation (Addendum)
Anesthesia Evaluation  Patient identified by MRN, date of birth, ID band Patient awake    Reviewed: Allergy & Precautions, H&P , NPO status , Patient's Chart, lab work & pertinent test results, reviewed documented beta blocker date and time   Airway Mallampati: II  TM Distance: >3 FB Neck ROM: full    Dental   Pulmonary          Cardiovascular hypertension, + CAD Normal cardiovascular exam+ pacemaker Rate:Normal     Neuro/Psych    GI/Hepatic GERD-  ,  Endo/Other  Hypothyroidism   Renal/GU      Musculoskeletal   Abdominal   Peds  Hematology   Anesthesia Other Findings   Reproductive/Obstetrics                            Anesthesia Physical Anesthesia Plan  ASA: III and emergent  Anesthesia Plan: General LMA   Post-op Pain Management:    Induction:   Airway Management Planned:   Additional Equipment:   Intra-op Plan:   Post-operative Plan:   Informed Consent: I have reviewed the patients History and Physical, chart, labs and discussed the procedure including the risks, benefits and alternatives for the proposed anesthesia with the patient or authorized representative who has indicated his/her understanding and acceptance.     Plan Discussed with: CRNA  Anesthesia Plan Comments:        Anesthesia Quick Evaluation

## 2014-10-08 NOTE — Op Note (Signed)
10/07/2014 - 10/08/2014  9:45 AM  PATIENT:  Rose Gates    PRE-OPERATIVE DIAGNOSIS:  wound and right radial fracture  POST-OPERATIVE DIAGNOSIS:  Same  PROCEDURE:  IRRIGATION AND DEBRIDEMENT EXTREMITY, CLOSED REDUCTION RADIAL SHAFT  SURGEON:  Thornton Park, MD  ANESTHESIA:   General  PREOPERATIVE INDICATIONS:  [name] is a  81 y.o. female with a diagnosis of open wound right forearm and fractures of the distal radius and ulna requiring irrigation and debridement, wound closure and fracture reduction and casting..    The risks benefits and alternatives were discussed with the patient's daughter preoperatively including but not limited to the risks of infection, wound healing problems and the loss of reduction requiring further surgery including ORIF, among others, and the patient's was willing to proceed.  OPERATIVE FINDINGS: Patient had volar angulation of the radius fracture. Patient had a 1-1/2-2 cm laceration over the ulnar border of the forearm approximately 4-5 cm proximal to the ulnar fracture. The laceration was down to the fascia but there was no fascial defect seen.  OPERATIVE PROCEDURE: Patient was met in the preoperative area. I spoke with the daughter at the bedside. I explained the patient has a distal both bone forearm fracture which needs to be reduced. Patient also has a laceration which was concerning for possible open fracture. She was irrigated initially in the ER and the skin approximated with Steri-Strips. She received a tetanus shot. Given the concern for infection of formal irrigation and debridement this morning was planned.  Patient was brought to the operating room. She was underwent general anesthesia. The right forearm was prepped and draped in a sterile fashion. The laceration over the ulnar border of the forearm was explored. The wound extended to the deep fascia but did not penetrate the fascia. The laceration was approximate 4-5 cm  proximal to the fracture. It was copiously irrigated with GU impregnated saline 1 L. There was no gross contamination. The wound was approximated with 3 simple sutures of 4-0 nylon. The wound was loosely approximated allowing for drainage. A dry sterile dressing was applied.   The drapes were then removed. Additional Webril was applied over the right forearm. Fiberglass casting material was wrapped over the right forearm. The cast was molded over the fracture site and an anatomic reduction of the radius and  near anatomic reduction of the ulna was achieved. Patient was then awoken and brought to the PACU in stable condition. A sling was applied to the right arm.  I spoke with the patient's daughter in the postop cover room to let her know the patient was stable in the recovery room in the case had proceeded without complication.

## 2014-10-08 NOTE — Anesthesia Procedure Notes (Signed)
Procedure Name: LMA Insertion Date/Time: 10/08/2014 8:35 AM Performed by: KGMWNUU, Tamyah Cutbirth Pre-anesthesia Checklist: Timeout performed, Patient being monitored, Suction available, Emergency Drugs available and Patient identified Oxygen Delivery Method: Circle system utilized Preoxygenation: Pre-oxygenation with 100% oxygen Intubation Type: IV induction LMA Size: 3.0

## 2014-10-08 NOTE — Progress Notes (Signed)
Patient returned from PACU.  Resting and does not appear to be in pain unless you disturb hand.  Daughter present at bedside.

## 2014-10-08 NOTE — Clinical Social Work Placement (Signed)
   CLINICAL SOCIAL WORK PLACEMENT  NOTE  Date:  10/08/2014  Patient Details  Name: Rose Gates MRN: [id number] Date of Birth: 1919/06/21  Clinical Social Work is seeking post-discharge placement for this patient at the Skilled  Nursing Facility level of care (*CSW will initial, date and re-position this form in  chart as items are completed):  Yes   Patient/family provided with [facility] Clinical Social Work Department's list of facilities offering this level of care within the geographic area requested by the patient (or if unable, by the patient's family).  Yes   Patient/family informed of their freedom to choose among providers that offer the needed level of care, that participate in Medicare, Medicaid or managed care program needed by the patient, have an available bed and are willing to accept the patient.  Yes   Patient/family informed of [facility]'s ownership interest in [facility] and [facility], as well as of the fact that they are under no obligation to receive care at these facilities.  PASRR submitted to EDS on 10/08/14     PASRR number received on 10/08/14     Existing PASRR number confirmed on       FL2 transmitted to all facilities in geographic area requested by pt/family on 10/08/14     FL2 transmitted to all facilities within larger geographic area on       Patient informed that his/her managed care company has contracts with or will negotiate with certain facilities, including the following:            Patient/family informed of bed offers received.  Patient chooses bed at       Physician recommends and patient chooses bed at      Patient to be transferred to   on  .  Patient to be transferred to facility by       Patient family notified on   of transfer.  Name of family member notified:        PHYSICIAN       Additional Comment:    _______________________________________________ Haig Prophet, LCSW 10/08/2014, 5:19 PM

## 2014-10-08 NOTE — Progress Notes (Signed)
Subjective:  POST-OP CHECK:  Patient is seen back in her hospital bed. Her daughter is at the bedside. Patient is resting comfortably. Her right arm is in a sling. Patient reports pain as mild.    Objective:   VITALS:   Filed Vitals:   10/08/14 1013 10/08/14 1027 10/08/14 1028 10/08/14 1106  BP: 131/51  138/47 125/46  Pulse: 62  69 61  Temp: 98.2 F (36.8 C)  97.6 F (36.4 C) 97.5 F (36.4 C)  TempSrc:   Oral Oral  Resp: 12  20 18   Height:      Weight:      SpO2: 95% 95% 95% 95%   Right upper extremity: Patient's cast is intact. Intact sensation light touch in all 5 digits. She can flex and extend all 5 digits as well. Her fingers are well-perfused.   LABS  Results for orders placed or performed during the hospital encounter of 10/07/14 (from the past 24 hour(s))  CBC     Status: None   Collection Time: 10/07/14 11:25 PM  Result Value Ref Range   WBC 9.7 3.6 - 11.0 K/uL   RBC 4.11 3.80 - 5.20 MIL/uL   Hemoglobin 12.6 12.0 - 16.0 g/dL   HCT 40.9 81.1 - 91.4 %   MCV 91.8 80.0 - 100.0 fL   MCH 30.5 26.0 - 34.0 pg   MCHC 33.2 32.0 - 36.0 g/dL   RDW 78.2 95.6 - 21.3 %   Platelets 255 150 - 440 K/uL  Comprehensive metabolic panel     Status: Abnormal   Collection Time: 10/07/14 11:25 PM  Result Value Ref Range   Sodium 139 135 - 145 mmol/L   Potassium 4.4 3.5 - 5.1 mmol/L   Chloride 106 101 - 111 mmol/L   CO2 24 22 - 32 mmol/L   Glucose, Bld 94 65 - 99 mg/dL   BUN 32 (H) 6 - 20 mg/dL   Creatinine, Ser 0.86 0.44 - 1.00 mg/dL   Calcium 8.9 8.9 - 57.8 mg/dL   Total Protein 6.7 6.5 - 8.1 g/dL   Albumin 3.4 (L) 3.5 - 5.0 g/dL   AST 18 15 - 41 U/L   ALT 9 (L) 14 - 54 U/L   Alkaline Phosphatase 76 38 - 126 U/L   Total Bilirubin 0.2 (L) 0.3 - 1.2 mg/dL   GFR calc non Af Amer 53 (L) >60 mL/min   GFR calc Af Amer >60 >60 mL/min   Anion gap 9 5 - 15  Troponin I     Status: None   Collection Time: 10/07/14 11:25 PM  Result Value Ref Range   Troponin I <0.03 <0.031 ng/mL   Protime-INR     Status: None   Collection Time: 10/07/14 11:45 PM  Result Value Ref Range   Prothrombin Time 14.4 11.4 - 15.0 seconds   INR 1.10   APTT     Status: None   Collection Time: 10/07/14 11:45 PM  Result Value Ref Range   aPTT 28 24 - 36 seconds  Surgical pcr screen     Status: None   Collection Time: 10/08/14  7:31 AM  Result Value Ref Range   MRSA, PCR NEGATIVE NEGATIVE   Staphylococcus aureus NEGATIVE NEGATIVE    Dg Forearm Right  10/08/2014   CLINICAL DATA:  Acute onset of right forearm pain after fall. Initial encounter.  EXAM: RIGHT FOREARM - 2 VIEW  COMPARISON:  None.  FINDINGS: There are mildly comminuted fractures involving the distal radial and  ulnar diaphyses, with mild volar angulation of both fractures, and mild ulnar angulation of the ulnar fracture. The fractures are somewhat oblique in nature, and there may be extension of the radial fracture to the radiocarpal joint.  The carpal rows appear grossly intact, and demonstrate normal alignment. Soft tissue swelling is noted about the fracture sites. The elbow joint is incompletely assessed, but appears grossly unremarkable.  IMPRESSION: Mildly comminuted fractures involving the distal radial and ulnar diaphyses, with mild volar angulation of both fractures, and mild ulnar angulation of the ulnar fracture. The fractures are somewhat oblique in nature, and there may be extension of the radial fracture to the radiocarpal joint.   Electronically Signed   By: Roanna Raider M.D.   On: 10/08/2014 00:44   Dg Wrist 2 Views Right  10/08/2014   CLINICAL DATA:  81 year old female with history of trauma from a fall with a fractures of the distal radius and ulna. Status post cast placement.  EXAM: RIGHT WRIST - 2 VIEW  COMPARISON:  10/07/2014.  FINDINGS: Interval placement of a plaster cast obscuring finer bony detail. Mildly comminuted fracture of the distal radial metadiaphysis and oblique fracture of the distal third of the ulnar  diaphysis are again noted. There is improved alignment, which is near anatomic in the radius. There continues to be approximately 25 degrees of ulnar angulation of the distal ulnar fracture, and approximately 4 mm of ulnar displacement.  IMPRESSION: 1. Status post cast fixation for distal radial and ulnar fractures, with improved alignment, as above.   Electronically Signed   By: Trudie Reed M.D.   On: 10/08/2014 10:31   Ct Head Wo Contrast  10/08/2014   CLINICAL DATA:  Status post fall at home. Concern for head injury. Initial encounter.  EXAM: CT HEAD WITHOUT CONTRAST  TECHNIQUE: Contiguous axial images were obtained from the base of the skull through the vertex without intravenous contrast.  COMPARISON:  MRI of the brain performed 10/22/2006  FINDINGS: There is no evidence of acute infarction, mass lesion, or intra- or extra-axial hemorrhage on CT.  Prominence of the ventricles and sulci reflects moderate cortical volume loss. Cerebellar atrophy is noted. Scattered periventricular and subcortical white matter change likely reflects small vessel ischemic microangiopathy. Small chronic lacunar infarcts are seen at the basal ganglia bilaterally.  The brainstem and fourth ventricle are within normal limits. The basal ganglia are unremarkable in appearance. The cerebral hemispheres demonstrate grossly normal gray-white differentiation. No mass effect or midline shift is seen.  There is no evidence of fracture; visualized osseous structures are unremarkable in appearance. The visualized portions of the orbits are within normal limits. The paranasal sinuses and mastoid air cells are well-aerated. No significant soft tissue abnormalities are seen.  IMPRESSION: 1. No evidence of traumatic intracranial injury or fracture. 2. Moderate cortical volume loss and scattered small vessel ischemic microangiopathy. 3. Small chronic lacunar infarcts at the basal ganglia bilaterally.   Electronically Signed   By: Roanna Raider M.D.   On: 10/08/2014 00:48   Chest Portable 1 View  10/08/2014   CLINICAL DATA:  Preop chest radiograph  EXAM: PORTABLE CHEST - 1 VIEW  COMPARISON:  09/26/2009  FINDINGS: There is mild unchanged cardiomegaly. There are intact appearances of the transvenous leads. The lungs are clear. There is no large effusion. The pulmonary vasculature is normal.  IMPRESSION: Unchanged cardiomegaly.  No acute findings.   Electronically Signed   By: Ellery Plunk M.D.   On: 10/08/2014 03:31  Assessment/Plan: Day of Surgery   Active Problems:   Forearm fracture  Patient is doing well postop. She will continue to elevate the right upper extremity. She should have her right arm in a sling when she is not bed. Continue neurovascular checks. Continue IV 24 hours for treatment of her open wound. I will reassess in the morning.    Juanell Fairly , MD 10/08/2014, 11:19 AM

## 2014-10-08 NOTE — H&P (Signed)
PREOPERATIVE H&P  Chief Complaint: Right open distal both bone forearm fracture  HPI: Rose Gates is a 81 y.o. female who presents with a right open distal both bone forearm fracture from a fall last night. Patient was irrigated in the ER and received antibiotics in a tetanus shot. She was placed in a sugar tong splint. She is being brought to the OR this morning for formal irrigation and debridement with wound closure and casting. Her daughter is at the bedside this morning.  Patient has dementia and is unable to provide History.  Past Medical History  Diagnosis Date  . Hypertension   . Coronary artery disease   . HLD (hyperlipidemia)   . A-fib   . Pacemaker   . Hypothyroidism   . GERD (gastroesophageal reflux disease)    Past Surgical History  Procedure Laterality Date  . Cardiac surgery    . Pacemaker insertion    . Cholecystectomy    . Abdominal hysterectomy     History   Social History  . Marital Status: Married    Spouse Name: N/A  . Number of Children: N/A  . Years of Education: N/A   Social History Main Topics  . Smoking status: Never Smoker   . Smokeless tobacco: Not on file  . Alcohol Use: No  . Drug Use: No  . Sexual Activity: Not on file   Other Topics Concern  . Not on file   Social History Narrative   Family History  Problem Relation Age of Onset  . Heart attack Mother   . Stroke Mother   . Rheum arthritis Father   . Rheum arthritis Sister     #1  . Rheum arthritis Sister     #2   Allergies  Allergen Reactions  . Codeine   . Penicillins   . Sulfa Antibiotics    Prior to Admission medications   Medication Sig Start Date End Date Taking? Authorizing Provider  amLODipine (NORVASC) 5 MG tablet Take 5 mg by mouth daily.   Yes Historical Provider, MD  aspirin 81 MG tablet Take 81 mg by mouth daily.   Yes Historical Provider, MD  levothyroxine (SYNTHROID, LEVOTHROID) 25 MCG tablet Take 25 mcg by mouth daily before breakfast.   Yes  Historical Provider, MD  lisinopril (PRINIVIL,ZESTRIL) 5 MG tablet Take 10 mg by mouth daily.   Yes Historical Provider, MD  sertraline (ZOLOFT) 25 MG tablet Take 25 mg by mouth daily.   Yes Historical Provider, MD     Positive ROS: All other systems have been reviewed and were otherwise negative with the exception of those mentioned in the HPI and as above.  Physical Exam: General: Alert, no acute distress Cardiovascular: Regular rate and rhythm, no murmurs rubs or gallops.  No pedal edema Respiratory: Clear to auscultation bilaterally, no wheezes rales or rhonchi. No cyanosis, no use of accessory musculature GI: No organomegaly, abdomen is soft and non-tender nondistended with positive bowel sounds. Neurologic: Sensation intact distally Psychiatric: Patient has dementia Lymphatic: No axillary or cervical lymphadenopathy  MUSCULOSKELETAL: Right upper extremity: Patient's fingers are well-perfused. Sugar tong splint is in place.  Further examination will be performed in the OR.  Assessment: Right open distal both bone forearm fracture   Plan: Plan for Procedure(s): IRRIGATION AND DEBRIDEMENT EXTREMITY CLOSED REDUCTION RADIAL SHAFT  Based on the volar laceration of the patient's wrist, the assumption has to be made that this is an open fracture. Therefore I'm taking the patient for formal irrigation and debridement,  approximate the skin and placed the patient in a short arm cast. I explained this procedure to her daughter along with the potential risks. She has been cleared by the medical service. I reviewed all labs and radiographic studies in preparation for this case.  I discussed the risks and benefits of surgery with the patient's daughter and she understands the risks of the procedure this morning include but are not limited to infection, wound healing problems and fracture displacement requiring later ORIF. Medical risks include but are not limited to DVT and pulmonary embolism,  myocardial infarction, stroke, pneumonia, respiratory failure and death. Patient's daughter understood these risks and wished to proceed.   Juanell Fairly, MD   10/08/2014 8:24 AM

## 2014-10-08 NOTE — H&P (Signed)
H&P  Chief Complaint: Right wrist pain s/p fall  HPI: Rose Gates is a 81 y.o. female who sustained a fall at home.  X-rays show a distal both bone forearm fracture.  Dr. Zenda Alpers from ER describes a volar wrist laceration/skin tear, possibly an open fracture.   Past Medical History  Diagnosis Date  . Hypertension   . Coronary artery disease    Past Surgical History  Procedure Laterality Date  . Cardiac surgery    . Pacemaker insertion    . Cholecystectomy    . Abdominal hysterectomy     History   Social History  . Marital Status: Married    Spouse Name: N/A  . Number of Children: N/A  . Years of Education: N/A   Social History Main Topics  . Smoking status: Never Smoker   . Smokeless tobacco: Not on file  . Alcohol Use: No  . Drug Use: No  . Sexual Activity: Not on file   Other Topics Concern  . Not on file   Social History Narrative  . No narrative on file   No family history on file. Allergies no known allergies Prior to Admission medications   Medication Sig Start Date End Date Taking? Authorizing Provider  amLODipine (NORVASC) 5 MG tablet Take 5 mg by mouth daily.   Yes Historical Provider, MD  aspirin 81 MG tablet Take 81 mg by mouth daily.   Yes Historical Provider, MD  levothyroxine (SYNTHROID, LEVOTHROID) 25 MCG tablet Take 25 mcg by mouth daily before breakfast.   Yes Historical Provider, MD  lisinopril (PRINIVIL,ZESTRIL) 5 MG tablet Take 10 mg by mouth daily.   Yes Historical Provider, MD  sertraline (ZOLOFT) 25 MG tablet Take 25 mg by mouth daily.   Yes Historical Provider, MD     Positive ROS: All other systems have been reviewed and were otherwise negative with the exception of those mentioned in the HPI and as above.  Physical Exam:  Will evaluate in the AM.  Patient reported to be neurovascularly intact.   Assessment: Right open distal both bone forearm fracture  Plan: Patient being admitted to orthopaedics for management of open  distal radius fracture.  Spoke with Dr. Zenda Alpers in the ED.  Recommended washing out wound, approximating skin with suture and giving kefzol and tetanus.  Will evaluate patient in the AM to determine if additional surgical intervention is required.  Continue antibiotics.  Juanell Fairly, MD   10/08/2014 2:49 AM

## 2014-10-09 MED ORDER — CEFAZOLIN SODIUM 1-5 GM-% IV SOLN
1.0000 g | INTRAVENOUS | Status: AC
Start: 2014-10-09 — End: 2014-10-09
  Administered 2014-10-09: 1 g via INTRAVENOUS
  Filled 2014-10-09: qty 50

## 2014-10-09 MED ORDER — CEFAZOLIN SODIUM 1-5 GM-% IV SOLN
1.0000 g | Freq: Three times a day (TID) | INTRAVENOUS | Status: DC
  Administered 2014-10-09 – 2014-10-10 (×3): 1 g via INTRAVENOUS
  Filled 2014-10-09 (×7): qty 50

## 2014-10-09 NOTE — Progress Notes (Signed)
Subjective:  Patient's postop day #1 from I&D and casting for a right distal both bone forearm fracture with forearm laceration. Patient reports pain as moderate.  Patient seen with her daughter at the bedside. Patient is up out of bed to a chair. She complains of moderate pain in the right forearm, but the daughter explains she has just been up to use the restroom.  Objective:   VITALS:   Filed Vitals:   10/09/14 0000 10/09/14 0334 10/09/14 0755 10/09/14 1000  BP: 135/45 139/66 141/66 130/80  Pulse: 72 72 80   Temp: 98 F (36.7 C) 98.1 F (36.7 C) 98.5 F (36.9 C)   TempSrc:  Oral Oral   Resp: 18 18 16    Height:      Weight:      SpO2: 96% 93% 100%    Right forearm: Cast is clean and intact. There is no evidence of skin breakdown. Patient's fingers are well-perfused. She has intact sensation in all 5 digits and can flex and extend all 5 digits on exam. Neurovascular intact Sensation intact distally Intact pulses distally Incision: dressing C/D/I  LABS  No results found for this or any previous visit (from the past 24 hour(s)).  Dg Forearm Right  10/08/2014   CLINICAL DATA:  Acute onset of right forearm pain after fall. Initial encounter.  EXAM: RIGHT FOREARM - 2 VIEW  COMPARISON:  None.  FINDINGS: There are mildly comminuted fractures involving the distal radial and ulnar diaphyses, with mild volar angulation of both fractures, and mild ulnar angulation of the ulnar fracture. The fractures are somewhat oblique in nature, and there may be extension of the radial fracture to the radiocarpal joint.  The carpal rows appear grossly intact, and demonstrate normal alignment. Soft tissue swelling is noted about the fracture sites. The elbow joint is incompletely assessed, but appears grossly unremarkable.  IMPRESSION: Mildly comminuted fractures involving the distal radial and ulnar diaphyses, with mild volar angulation of both fractures, and mild ulnar angulation of the ulnar fracture.  The fractures are somewhat oblique in nature, and there may be extension of the radial fracture to the radiocarpal joint.   Electronically Signed   By: Roanna Raider M.D.   On: 10/08/2014 00:44   Dg Wrist 2 Views Right  10/08/2014   CLINICAL DATA:  81 year old female with history of trauma from a fall with a fractures of the distal radius and ulna. Status post cast placement.  EXAM: RIGHT WRIST - 2 VIEW  COMPARISON:  10/07/2014.  FINDINGS: Interval placement of a plaster cast obscuring finer bony detail. Mildly comminuted fracture of the distal radial metadiaphysis and oblique fracture of the distal third of the ulnar diaphysis are again noted. There is improved alignment, which is near anatomic in the radius. There continues to be approximately 25 degrees of ulnar angulation of the distal ulnar fracture, and approximately 4 mm of ulnar displacement.  IMPRESSION: 1. Status post cast fixation for distal radial and ulnar fractures, with improved alignment, as above.   Electronically Signed   By: Trudie Reed M.D.   On: 10/08/2014 10:31   Ct Head Wo Contrast  10/08/2014   CLINICAL DATA:  Status post fall at home. Concern for head injury. Initial encounter.  EXAM: CT HEAD WITHOUT CONTRAST  TECHNIQUE: Contiguous axial images were obtained from the base of the skull through the vertex without intravenous contrast.  COMPARISON:  MRI of the brain performed 10/22/2006  FINDINGS: There is no evidence of acute infarction, mass lesion,  or intra- or extra-axial hemorrhage on CT.  Prominence of the ventricles and sulci reflects moderate cortical volume loss. Cerebellar atrophy is noted. Scattered periventricular and subcortical white matter change likely reflects small vessel ischemic microangiopathy. Small chronic lacunar infarcts are seen at the basal ganglia bilaterally.  The brainstem and fourth ventricle are within normal limits. The basal ganglia are unremarkable in appearance. The cerebral hemispheres demonstrate  grossly normal gray-white differentiation. No mass effect or midline shift is seen.  There is no evidence of fracture; visualized osseous structures are unremarkable in appearance. The visualized portions of the orbits are within normal limits. The paranasal sinuses and mastoid air cells are well-aerated. No significant soft tissue abnormalities are seen.  IMPRESSION: 1. No evidence of traumatic intracranial injury or fracture. 2. Moderate cortical volume loss and scattered small vessel ischemic microangiopathy. 3. Small chronic lacunar infarcts at the basal ganglia bilaterally.   Electronically Signed   By: Roanna Raider M.D.   On: 10/08/2014 00:48   Chest Portable 1 View  10/08/2014   CLINICAL DATA:  Preop chest radiograph  EXAM: PORTABLE CHEST - 1 VIEW  COMPARISON:  09/26/2009  FINDINGS: There is mild unchanged cardiomegaly. There are intact appearances of the transvenous leads. The lungs are clear. There is no large effusion. The pulmonary vasculature is normal.  IMPRESSION: Unchanged cardiomegaly.  No acute findings.   Electronically Signed   By: Ellery Plunk M.D.   On: 10/08/2014 03:31    Assessment/Plan: 1 Day Post-Op   Active Problems:   Forearm fracture   Patient should continue physical therapy to practice use of a single arm walker. Patient's daughter explains that she could not use a platform walker today. Continue IV antibiotics for her wound for infection prophylaxis. Patient will likely need SNF care upon discharge. Continue to elevate right upper extremity. Use right arm sling when standing or ambulating.   Juanell Fairly , MD 10/09/2014, 3:18 PM

## 2014-10-09 NOTE — Progress Notes (Signed)
Physical Therapy Treatment Patient Details Name: Rose Gates MRN: [id number] DOB: 1919/10/31 Today's Date: 10/09/2014    History of Present Illness 81 yo female fell in unwitnessed fall overnight and had sugery to ensure she was not protruding bone, now casted and set with NWB through R wrist and hand on platform.  Cardiomegaly    PT Comments    Pt was able to assist with standing with hemiwalker but cannot touch her RUE at all, left in sling for transition.  Had her positioned in chair with pillows behind her back at at each side to support and control for painful movement of R arm.  More restful look after getting her there.  Follow Up Recommendations  SNF     Equipment Recommendations  Other (comment) (hemiwalker when dc to home)    Recommendations for Other Services       Precautions / Restrictions Precautions Precautions: Fall Restrictions Weight Bearing Restrictions: Yes RUE Weight Bearing: Non weight bearing    Mobility  Bed Mobility Overal bed mobility: Needs Assistance Bed Mobility: Supine to Sit     Supine to sit: Mod assist     General bed mobility comments: assisted trunk more and legs minimally  Transfers Overall transfer level: Needs assistance Equipment used: Hemi-walker;1 person hand held assist Transfers: Sit to/from Stand;Stand Pivot Transfers Sit to Stand: Mod assist Stand pivot transfers: Mod assist       General transfer comment: gait belt and hemiwalker to stand  Ambulation/Gait             General Gait Details: Transfers to chair only   Stairs            Wheelchair Mobility    Modified Rankin (Stroke Patients Only)       Balance Overall balance assessment: Needs assistance Sitting-balance support: Feet supported Sitting balance-Leahy Scale: Fair     Standing balance support: Single extremity supported Standing balance-Leahy Scale: Poor Standing balance comment: Still cannot support her under R elbow to  stand up                    Cognition Arousal/Alertness: Lethargic Behavior During Therapy: Anxious Overall Cognitive Status: Impaired/Different from baseline Area of Impairment: Orientation;Attention;Following commands;Safety/judgement;Awareness;Problem solving;Memory Orientation Level: Situation;Time Current Attention Level: Alternating Memory: Decreased recall of precautions;Decreased short-term memory Following Commands: Follows one step commands inconsistently Safety/Judgement: Decreased awareness of safety;Decreased awareness of deficits Awareness: Intellectual Problem Solving: Slow processing;Difficulty sequencing;Requires verbal cues General Comments: HOH and makes instructing her much more complex, no hearing augmentation    Exercises      General Comments General comments (skin integrity, edema, etc.): Continues to have issues with RUE use at all and hemiwalker in sling wiht gait belt is most practical.  Family on board for SNF.      Pertinent Vitals/Pain Pain Assessment: Faces Pain Score: 8  Faces Pain Scale: Hurts whole lot Pain Location: R arm even through elbow Pain Intervention(s): Limited activity within patient's tolerance;Monitored during session;Premedicated before session;Repositioned    Home Living                      Prior Function            PT Goals (current goals can now be found in the care plan section) Acute Rehab PT Goals Patient Stated Goal: none stated Progress towards PT goals: Progressing toward goals    Frequency  7X/week    PT Plan Current plan remains appropriate  Co-evaluation             End of Session Equipment Utilized During Treatment: Gait belt Activity Tolerance: Patient limited by pain;Patient limited by fatigue Patient left: in chair;with call bell/phone within reach;with family/visitor present     Time: 1031-1100 PT Time Calculation (min) (ACUTE ONLY): 29 min  Charges:  $Gait Training:  8-22 mins $Therapeutic Activity: 8-22 mins                    G Codes:      Ivar Drape Oct 28, 2014, 3:18 PM   Samul Dada, PT MS Acute Rehab Dept. Number: [facility] [id number] and [facility] [phone]

## 2014-10-10 ENCOUNTER — Encounter: Payer: Self-pay | Admitting: Orthopedic Surgery

## 2014-10-10 MED ORDER — CEPHALEXIN 500 MG PO CAPS: 500.0000 mg | ORAL_CAPSULE | Freq: Four times a day (QID) | ORAL | Status: AC

## 2014-10-10 MED ORDER — ACETAMINOPHEN 325 MG PO TABS: 650.0000 mg | ORAL_TABLET | Freq: Four times a day (QID) | ORAL | Status: AC | PRN

## 2014-10-10 MED ORDER — HYDROCODONE-ACETAMINOPHEN 5-325 MG PO TABS: 1.0000 | ORAL_TABLET | ORAL | Status: AC | PRN

## 2014-10-10 NOTE — Discharge Summary (Addendum)
Physician Discharge Summary  Patient ID: Rose Gates MRN: [id number] DOB/AGE: [birth year] 79 y.o.  Admit date: 10/07/2014 Discharge date: 10/10/2014  Admission Diagnoses:  Right distal both bone forearm fracture Right Forearm laceration  Discharge Diagnoses:  Active Problems:   Forearm fracture  right forearm laceration  Past Medical History  Diagnosis Date  . Hypertension   . Coronary artery disease   . HLD (hyperlipidemia)   . A-fib   . Pacemaker 2011-07  . Hypothyroidism   . GERD (gastroesophageal reflux disease)     Surgeries: Procedure(s): IRRIGATION AND DEBRIDEMENT EXTREMITY CLOSED REDUCTION RADIAL SHAFT on 10/07/2014 - 10/08/2014   Consultants (if any):    Discharged Condition: Improved  Hospital Course: Rose Gates is an 81 y.o. female who was admitted 10/07/2014 with a diagnosis of <principal problem not specified> and went to the operating room on 10/07/2014 - 10/08/2014 and underwent the above named procedures.    She was given perioperative antibiotics:  Anti-infectives    Start     Dose/Rate Route Frequency Ordered Stop   10/09/14 1530  ceFAZolin (ANCEF) IVPB 1 g/50 mL premix     1 g 100 mL/hr over 30 Minutes Intravenous 3 times per day 10/09/14 1517     10/09/14 1530  ceFAZolin (ANCEF) IVPB 1 g/50 mL premix     1 g 100 mL/hr over 30 Minutes Intravenous NOW 10/09/14 1523 10/09/14 1701   10/08/14 1500  ceFAZolin (ANCEF) IVPB 1 g/50 mL premix     1 g 100 mL/hr over 30 Minutes Intravenous Every 6 hours 10/08/14 1459 10/08/14 2225   10/08/14 1030  ceFAZolin (ANCEF) IVPB 1 g/50 mL premix  Status:  Discontinued     1 g 100 mL/hr over 30 Minutes Intravenous Every 6 hours 10/08/14 1027 10/08/14 1459   10/08/14 0600  ceFAZolin (ANCEF) IVPB 1 g/50 mL premix  Status:  Discontinued     1 g 100 mL/hr over 30 Minutes Intravenous 3 times per day 10/08/14 0312 10/08/14 0313   10/08/14 0245  ceFAZolin (ANCEF) IVPB 1 g/50 mL premix     1 g 100 mL/hr over 30  Minutes Intravenous  Once 10/08/14 0233 10/08/14 0719    .  Patient had irrigation and debridement of her right forearm laceration on 10/08/2014. The laceration was closed. She had a closed reduction of her distal both bone forearm fracture with application of a short arm cast. She was admitted for IV antibiotics, neurovascular monitoring and pain control.  She benefited maximally from the hospital stay and there were no complications.    Recent vital signs:  Filed Vitals:   10/10/14 1446  BP: 119/41  Pulse: 62  Temp: 97.7 F (36.5 C)  Resp: 16    Recent laboratory studies:  Lab Results  Component Value Date   HGB 12.6 10/07/2014   Lab Results  Component Value Date   WBC 9.7 10/07/2014   PLT 255 10/07/2014   Lab Results  Component Value Date   INR 1.10 10/07/2014   Lab Results  Component Value Date   NA 139 10/07/2014   K 4.4 10/07/2014   CL 106 10/07/2014   CO2 24 10/07/2014   BUN 32* 10/07/2014   CREATININE 0.91 10/07/2014   GLUCOSE 94 10/07/2014    Discharge Medications:     Medication List    ASK your doctor about these medications        amLODipine 5 MG tablet  Commonly known as:  NORVASC  Take 5 mg by  mouth daily.     aspirin 81 MG tablet  Take 81 mg by mouth daily.     levothyroxine 25 MCG tablet  Commonly known as:  SYNTHROID, LEVOTHROID  Take 25 mcg by mouth daily before breakfast.     lisinopril 5 MG tablet  Commonly known as:  PRINIVIL,ZESTRIL  Take 10 mg by mouth daily.     sertraline 25 MG tablet  Commonly known as:  ZOLOFT  Take 25 mg by mouth daily.      Kefzol 500mg  tablets 1 tab PO QID x 10 days    Norco 5/325 mg 1- 2 tabs PO Q 4-6 hours prn severe pain  #30  (prescription sent in dc packet)    Diagnostic Studies: Dg Forearm Right  10/08/2014   CLINICAL DATA:  Acute onset of right forearm pain after fall. Initial encounter.  EXAM: RIGHT FOREARM - 2 VIEW  COMPARISON:  None.  FINDINGS: There are mildly comminuted fractures  involving the distal radial and ulnar diaphyses, with mild volar angulation of both fractures, and mild ulnar angulation of the ulnar fracture. The fractures are somewhat oblique in nature, and there may be extension of the radial fracture to the radiocarpal joint.  The carpal rows appear grossly intact, and demonstrate normal alignment. Soft tissue swelling is noted about the fracture sites. The elbow joint is incompletely assessed, but appears grossly unremarkable.  IMPRESSION: Mildly comminuted fractures involving the distal radial and ulnar diaphyses, with mild volar angulation of both fractures, and mild ulnar angulation of the ulnar fracture. The fractures are somewhat oblique in nature, and there may be extension of the radial fracture to the radiocarpal joint.   Electronically Signed   By: Roanna Gates M.D.   On: 10/08/2014 00:44   Dg Wrist 2 Views Right  10/08/2014   CLINICAL DATA:  81 year old female with history of trauma from a fall with a fractures of the distal radius and ulna. Status post cast placement.  EXAM: RIGHT WRIST - 2 VIEW  COMPARISON:  10/07/2014.  FINDINGS: Interval placement of a plaster cast obscuring finer bony detail. Mildly comminuted fracture of the distal radial metadiaphysis and oblique fracture of the distal third of the ulnar diaphysis are again noted. There is improved alignment, which is near anatomic in the radius. There continues to be approximately 25 degrees of ulnar angulation of the distal ulnar fracture, and approximately 4 mm of ulnar displacement.  IMPRESSION: 1. Status post cast fixation for distal radial and ulnar fractures, with improved alignment, as above.   Electronically Signed   By: Rose Gates M.D.   On: 10/08/2014 10:31   Ct Head Wo Contrast  10/08/2014   CLINICAL DATA:  Status post fall at home. Concern for head injury. Initial encounter.  EXAM: CT HEAD WITHOUT CONTRAST  TECHNIQUE: Contiguous axial images were obtained from the base of the skull  through the vertex without intravenous contrast.  COMPARISON:  MRI of the brain performed 10/22/2006  FINDINGS: There is no evidence of acute infarction, mass lesion, or intra- or extra-axial hemorrhage on CT.  Prominence of the ventricles and sulci reflects moderate cortical volume loss. Cerebellar atrophy is noted. Scattered periventricular and subcortical white matter change likely reflects small vessel ischemic microangiopathy. Small chronic lacunar infarcts are seen at the basal ganglia bilaterally.  The brainstem and fourth ventricle are within normal limits. The basal ganglia are unremarkable in appearance. The cerebral hemispheres demonstrate grossly normal gray-white differentiation. No mass effect or midline shift is seen.  There is no evidence of fracture; visualized osseous structures are unremarkable in appearance. The visualized portions of the orbits are within normal limits. The paranasal sinuses and mastoid air cells are well-aerated. No significant soft tissue abnormalities are seen.  IMPRESSION: 1. No evidence of traumatic intracranial injury or fracture. 2. Moderate cortical volume loss and scattered small vessel ischemic microangiopathy. 3. Small chronic lacunar infarcts at the basal ganglia bilaterally.   Electronically Signed   By: Roanna Gates M.D.   On: 10/08/2014 00:48   Chest Portable 1 View  10/08/2014   CLINICAL DATA:  Preop chest radiograph  EXAM: PORTABLE CHEST - 1 VIEW  COMPARISON:  09/26/2009  FINDINGS: There is mild unchanged cardiomegaly. There are intact appearances of the transvenous leads. The lungs are clear. There is no large effusion. The pulmonary vasculature is normal.  IMPRESSION: Unchanged cardiomegaly.  No acute findings.   Electronically Signed   By: Ellery Plunk M.D.   On: 10/08/2014 03:31    Disposition:   The patient is doing well postop.  She is continuing to improve. She is ready for discharge to skilled nursing facility. She'll follow-up in my office  in 7-10 days.     Signed: Juanell Fairly ,MD 10/10/2014, 3:50 PM

## 2014-10-10 NOTE — Progress Notes (Signed)
Patient is medically stable for D/C to [facility] today. Per [facility] admissions coordinator at [facility] patient is going to room 505. RN called report to 500 hall RN and arranged EMS for transport. Clinical Child psychotherapist (CSW) prepared D/C packet and sent D/C Summary to [facility] via carefinder. Patient's daughter Rose Gates and son Rose Gates are aware of above. Gala Romney is coming to hospital to complete admissions paper work with [name]. Please reconsult if future social work needs arise. CSW signing off.   Jetta Lout, LCSWA [phone]

## 2014-10-10 NOTE — Clinical Social Work Placement (Signed)
   CLINICAL SOCIAL WORK PLACEMENT  NOTE  Date:  10/10/2014  Patient Details  Name: Rose Gates MRN: [id number] Date of Birth: May 19, 1919  Clinical Social Work is seeking post-discharge placement for this patient at the Skilled  Nursing Facility level of care (*CSW will initial, date and re-position this form in  chart as items are completed):  Yes   Patient/family provided with [place] Clinical Social Work Department's list of facilities offering this level of care within the geographic area requested by the patient (or if unable, by the patient's family).  Yes   Patient/family informed of their freedom to choose among providers that offer the needed level of care, that participate in Medicare, Medicaid or managed care program needed by the patient, have an available bed and are willing to accept the patient.  Yes   Patient/family informed of [place]'s ownership interest in [facility] and [facility], as well as of the fact that they are under no obligation to receive care at these facilities.  PASRR submitted to EDS on 10/08/14     PASRR number received on 10/08/14     Existing PASRR number confirmed on       FL2 transmitted to all facilities in geographic area requested by pt/family on 10/08/14     FL2 transmitted to all facilities within larger geographic area on       Patient informed that his/her managed care company has contracts with or will negotiate with certain facilities, including the following:        Yes   Patient/family informed of bed offers received.  Patient chooses bed at  [facility] )     Physician recommends and patient chooses bed at      Patient to be transferred to  [facility] ) on 10/10/14.  Patient to be transferred to facility by  [facility] )     Patient family notified on 10/10/14 of transfer.  Name of family member notified:   (Daughter Maureen Ralphs is aware of D/C. )     PHYSICIAN       Additional  Comment:    _______________________________________________ Haig Prophet, LCSW 10/10/2014, 10:44 AM

## 2014-10-10 NOTE — Progress Notes (Signed)
Spoke with Durward Mallard, [facility] rep at [phone], to notify of non-emergent EMS transport.  Auth notification reference given as [id number].   Service date range good from 10/10/14 - 01/08/15.   Gap exception requested to determine if services can be considered at an in-network level.

## 2014-10-10 NOTE — Progress Notes (Signed)
Pt discharged from hospital to [facility] via ems. Spoke to era,lpn.

## 2014-10-10 NOTE — Progress Notes (Signed)
  Subjective:  Patient is postop day #2 status post closure of right forearm laceration and close reduction and casting of a both bone forearm fracture.  Patient reports pain as mild.  Patient is comfortable lying in her hospital bed. She denies any significant pain. She has no other complaints. Patient has dementia and has difficulty giving an accurate medical history.  Objective:   VITALS:   Filed Vitals:   10/09/14 1937 10/10/14 0422 10/10/14 0811 10/10/14 1446  BP: 127/40 165/72 150/64 119/41  Pulse: 62 81 64 62  Temp: 98.1 F (36.7 C) 97.4 F (36.3 C) 98 F (36.7 C) 97.7 F (36.5 C)  TempSrc: Oral Oral Oral Oral  Resp: Height:      Weight:      SpO2: 95% 100% 99% 99%   Right upper extremity: Cast is intact and clean. There is no evidence of skin breakdown. She can flex and extend her digits. Her fingers appear well perfused and she has intact sensation light touch. Neurovascular intact Sensation intact distally  LABS  No results found for this or any previous visit (from the past 24 hour(s)).  No results found.  Assessment/Plan: 2 Days Post-Op   Active Problems:   Forearm fracture  Patient is doing postop. She'll be discharged on antibiotics infection prophylaxis for her forearm laceration. She'll follow-up with me in 7-10 days in the office.   Juanell Fairly , MD 10/10/2014, 3:44 PM

## 2014-10-10 NOTE — Progress Notes (Signed)
Physical Therapy Treatment Patient Details Name: Rose Gates MRN: [id number] DOB: [birth year] Today's Date: 10/10/2014    History of Present Illness 81 yo female fell in unwitnessed fall overnight and had sugery to ensure she was not protruding bone, now casted and set with NWB through R wrist and hand on platform.  Cardiomegaly    PT Comments    Pt performed exercises and tasks well today, however she still complains about significant pain with mobility. She needs heavy cueing in order to perform exercises correctly and for proper use of hemi walker (trial platform walker soon). Pt will continue to benefit from skilled PT to address strength and pain deficits in order for her to safely return home. This entire session was guided, instructed, and directly supervised by Elizabeth Palau, DPT.  Follow Up Recommendations  SNF     Equipment Recommendations   (hemi walker when d/c to home)    Recommendations for Other Services       Precautions / Restrictions Precautions Precautions: Fall Restrictions Weight Bearing Restrictions: Yes RUE Weight Bearing: Non weight bearing    Mobility  Bed Mobility Overal bed mobility: Needs Assistance Bed Mobility: Supine to Sit     Supine to sit: Min assist     General bed mobility comments: requires trunk assist and guarding of RUE in order to get to EOB  Transfers Overall transfer level: Needs assistance Equipment used: Hemi-walker;1 person hand held assist Transfers: Sit to/from Stand Sit to Stand: Min assist         General transfer comment: Pt needs minor trunk control to reach full standing. Reaches behind her when sitting to lower herself. Fair functional strength.   Ambulation/Gait Ambulation/Gait assistance: Min assist Ambulation Distance (Feet): 6 Feet Assistive device: Hemi-walker Gait Pattern/deviations: Decreased step length - right;Decreased step length - left;Step-to pattern;Decreased dorsiflexion -  right;Decreased dorsiflexion - left;Shuffle Gait velocity: decreased   General Gait Details: Pt needs min assist for how to advance hemi walker with gait. Pt needs lots of reinforcement on how to use the hemi walker. Hand held assist was tried as well and pt's gait improved with this method.    Stairs            Wheelchair Mobility    Modified Rankin (Stroke Patients Only)       Balance Overall balance assessment: History of Falls                                  Cognition Arousal/Alertness: Awake/alert Behavior During Therapy: Anxious Overall Cognitive Status: Within Functional Limits for tasks assessed                      Exercises Other Exercises Other Exercises: Pt performed bilateral LE bed exercise x10 reps with min assist for facilitation of movement. Exercises included: ankle pumps, hip abd, SLR, quad sets, and glute sets.     General Comments        Pertinent Vitals/Pain Pain Assessment: 0-10 Pain Score: 6  Pain Location: R arm Pain Intervention(s): Monitored during session;Limited activity within patient's tolerance;Premedicated before session;Repositioned    Home Living                      Prior Function            PT Goals (current goals can now be found in the care plan section) Acute Rehab PT Goals  Patient Stated Goal: to participate in therapy  PT Goal Formulation: With patient/family Time For Goal Achievement: 10/22/14 Potential to Achieve Goals: Good Progress towards PT goals: Progressing toward goals    Frequency  7X/week    PT Plan Current plan remains appropriate    Co-evaluation             End of Session Equipment Utilized During Treatment: Gait belt Activity Tolerance: Patient limited by pain;Patient limited by fatigue Patient left: in chair;with call bell/phone within reach;with family/visitor present;with chair alarm set     Time: 4098-1191 PT Time Calculation (min) (ACUTE ONLY):  23 min  Charges:                       G CodesBenna Dunks Oct 11, 2014, 1:23 PM  Benna Dunks, SPT. [phone]

## 2014-10-10 NOTE — Progress Notes (Signed)
Physical Therapy Treatment Patient Details Name: Rose Gates MRN: [id number] DOB: 05/06/1919 Today's Date: 10/10/2014    History of Present Illness 81 yo female fell in unwitnessed fall overnight and had sugery to ensure she was not protruding bone, now casted and set with NWB through R wrist and hand on platform.  Cardiomegaly    PT Comments    Pt performed exercises and tasks well today, however she still complains about significant pain with mobility. She needs heavy cueing in order to perform exercises correctly and for proper use of hemi walker (trial platform walker soon). Pt will continue to benefit from skilled PT to address strength and pain deficits in order for her to safely return home.   Follow Up Recommendations  SNF     Equipment Recommendations   (hemi walker when d/c to home)    Recommendations for Other Services       Precautions / Restrictions Precautions Precautions: Fall Restrictions Weight Bearing Restrictions: Yes RUE Weight Bearing: Non weight bearing    Mobility  Bed Mobility Overal bed mobility: Needs Assistance Bed Mobility: Supine to Sit     Supine to sit: Min assist     General bed mobility comments: requires trunk assist and guarding of RUE in order to get to EOB  Transfers Overall transfer level: Needs assistance Equipment used: Hemi-walker;1 person hand held assist Transfers: Sit to/from Stand Sit to Stand: Min assist         General transfer comment: Pt needs minor trunk control to reach full standing. Reaches behind her when sitting to lower herself. Fair functional strength.   Ambulation/Gait Ambulation/Gait assistance: Min assist Ambulation Distance (Feet): 6 Feet Assistive device: Hemi-walker Gait Pattern/deviations: Decreased step length - right;Decreased step length - left;Step-to pattern;Decreased dorsiflexion - right;Decreased dorsiflexion - left;Shuffle Gait velocity: decreased   General Gait Details: Pt  needs min assist for how to advance hemi walker with gait. Pt needs lots of reinforcement on how to use the hemi walker. Hand held assist was tried as well and pt's gait improved with this method.    Stairs            Wheelchair Mobility    Modified Rankin (Stroke Patients Only)       Balance Overall balance assessment: History of Falls                                  Cognition Arousal/Alertness: Awake/alert Behavior During Therapy: Anxious Overall Cognitive Status: Within Functional Limits for tasks assessed                      Exercises Other Exercises Other Exercises: Pt performed bilateral LE bed exercise x10 reps with min assist for facilitation of movement. Exercises included: ankle pumps, hip abd, SLR, quad sets, and glute sets.     General Comments        Pertinent Vitals/Pain Pain Assessment: 0-10 Pain Score: 6  Pain Location: R arm Pain Intervention(s): Monitored during session;Limited activity within patient's tolerance;Premedicated before session;Repositioned    Home Living                      Prior Function            PT Goals (current goals can now be found in the care plan section) Acute Rehab PT Goals Patient Stated Goal: to participate in therapy  PT Goal Formulation: With  patient/family Time For Goal Achievement: 10/22/14 Potential to Achieve Goals: Good Progress towards PT goals: Progressing toward goals    Frequency  7X/week    PT Plan Current plan remains appropriate    Co-evaluation             End of Session Equipment Utilized During Treatment: Gait belt Activity Tolerance: Patient limited by pain;Patient limited by fatigue Patient left: in chair;with call bell/phone within reach;with family/visitor present;with chair alarm set     Time: 4098-1191 PT Time Calculation (min) (ACUTE ONLY): 23 min  Charges:                       G CodesBenna Dunks 10-12-2014, 12:58 PM  Benna Dunks, SPT. [phone]

## 2014-10-10 NOTE — Progress Notes (Signed)
Clinical Child psychotherapist (CSW) attempted to meet with patient and provide bed offers. Patient was asleep. CSW contacted patient's daughter Maureen Ralphs and presented bed offers. Daughter chose [facility]. CSW made daughter aware that patient will likely D/C to [facility] today. CSW will continue to follow and assist as needed.   Jetta Lout, LCSWA [phone]

## 2014-12-01 NOTE — Addendum Note (Signed)
Addendum  created 12/01/14 1610 by Yevette Edwards, MD   Modules edited: Anesthesia Responsible Staff

## 2015-03-03 ENCOUNTER — Emergency Department: Payer: Medicare Other

## 2015-03-03 ENCOUNTER — Emergency Department
Admission: EM | Admit: 2015-03-03 | Discharge: 2015-03-03 | Disposition: A | Discharge status: Home / Self Care | Payer: Medicare Other | Attending: Emergency Medicine | Admitting: Emergency Medicine

## 2015-03-03 ENCOUNTER — Encounter: Payer: Self-pay | Admitting: Emergency Medicine

## 2015-03-03 DIAGNOSIS — Z880 Allergy status to penicillin: Secondary | ICD-10-CM | POA: Diagnosis not present

## 2015-03-03 DIAGNOSIS — Z792 Long term (current) use of antibiotics: Secondary | ICD-10-CM | POA: Insufficient documentation

## 2015-03-03 DIAGNOSIS — Y9289 Other specified places as the place of occurrence of the external cause: Secondary | ICD-10-CM | POA: Diagnosis not present

## 2015-03-03 DIAGNOSIS — Y998 Other external cause status: Secondary | ICD-10-CM | POA: Diagnosis not present

## 2015-03-03 DIAGNOSIS — F0390 Unspecified dementia without behavioral disturbance: Secondary | ICD-10-CM | POA: Diagnosis not present

## 2015-03-03 DIAGNOSIS — R296 Repeated falls: Secondary | ICD-10-CM | POA: Insufficient documentation

## 2015-03-03 DIAGNOSIS — Z7982 Long term (current) use of aspirin: Secondary | ICD-10-CM | POA: Insufficient documentation

## 2015-03-03 DIAGNOSIS — S0181XA Laceration without foreign body of other part of head, initial encounter: Secondary | ICD-10-CM | POA: Diagnosis present

## 2015-03-03 DIAGNOSIS — W19XXXA Unspecified fall, initial encounter: Secondary | ICD-10-CM

## 2015-03-03 DIAGNOSIS — IMO0002 Reserved for concepts with insufficient information to code with codable children: Secondary | ICD-10-CM

## 2015-03-03 DIAGNOSIS — Z79899 Other long term (current) drug therapy: Secondary | ICD-10-CM | POA: Diagnosis not present

## 2015-03-03 DIAGNOSIS — I10 Essential (primary) hypertension: Secondary | ICD-10-CM | POA: Diagnosis not present

## 2015-03-03 DIAGNOSIS — W01198A Fall on same level from slipping, tripping and stumbling with subsequent striking against other object, initial encounter: Secondary | ICD-10-CM | POA: Diagnosis not present

## 2015-03-03 DIAGNOSIS — Y9301 Activity, walking, marching and hiking: Secondary | ICD-10-CM | POA: Insufficient documentation

## 2015-03-03 NOTE — ED Notes (Signed)
Dr Derrill Kaygoodman at bedside talking to pts son and daughter.

## 2015-03-03 NOTE — Discharge Instructions (Signed)
Please seek medical attention for any high fevers, chest pain, shortness of breath, change in behavior, persistent vomiting, bloody stool or any other new or concerning symptoms. ° °Laceration Care, Adult °A laceration is a cut that goes through all of the layers of the skin and into the tissue that is right under the skin. Some lacerations heal on their own. Others need to be closed with stitches (sutures), staples, skin adhesive strips, or skin glue. Proper laceration care minimizes the risk of infection and helps the laceration to heal better. °HOW TO CARE FOR YOUR LACERATION °If sutures or staples were used: °· Keep the wound clean and dry. °· If you were given a bandage (dressing), you should change it at least one time per day or as told by your health care provider. You should also change it if it becomes wet or dirty. °· Keep the wound completely dry for the first 24 hours or as told by your health care provider. After that time, you may shower or bathe. However, make sure that the wound is not soaked in water until after the sutures or staples have been removed. °· Clean the wound one time each day or as told by your health care provider: °¨ Wash the wound with soap and water. °¨ Rinse the wound with water to remove all soap. °¨ Pat the wound dry with a clean towel. Do not rub the wound. °· After cleaning the wound, apply a thin layer of antibiotic ointment as told by your health care provider. This will help to prevent infection and keep the dressing from sticking to the wound. °· Have the sutures or staples removed as told by your health care provider. °If skin adhesive strips were used: °· Keep the wound clean and dry. °· If you were given a bandage (dressing), you should change it at least one time per day or as told by your health care provider. You should also change it if it becomes dirty or wet. °· Do not get the skin adhesive strips wet. You may shower or bathe, but be careful to keep the wound  dry. °· If the wound gets wet, pat it dry with a clean towel. Do not rub the wound. °· Skin adhesive strips fall off on their own. You may trim the strips as the wound heals. Do not remove skin adhesive strips that are still stuck to the wound. They will fall off in time. °If skin glue was used: °· Try to keep the wound dry, but you may briefly wet it in the shower or bath. Do not soak the wound in water, such as by swimming. °· After you have showered or bathed, gently pat the wound dry with a clean towel. Do not rub the wound. °· Do not do any activities that will make you sweat heavily until the skin glue has fallen off on its own. °· Do not apply liquid, cream, or ointment medicine to the wound while the skin glue is in place. Using those may loosen the film before the wound has healed. °· If you were given a bandage (dressing), you should change it at least one time per day or as told by your health care provider. You should also change it if it becomes dirty or wet. °· If a dressing is placed over the wound, be careful not to apply tape directly over the skin glue. Doing that may cause the glue to be pulled off before the wound has healed. °· Do not   pick at the glue. The skin glue usually remains in place for 5-10 days, then it falls off of the skin. °General Instructions °· Take over-the-counter and prescription medicines only as told by your health care provider. °· If you were prescribed an antibiotic medicine or ointment, take or apply it as told by your doctor. Do not stop using it even if your condition improves. °· To help prevent scarring, make sure to cover your wound with sunscreen whenever you are outside after stitches are removed, after adhesive strips are removed, or when glue remains in place and the wound is healed. Make sure to wear a sunscreen of at least 30 SPF. °· Do not scratch or pick at the wound. °· Keep all follow-up visits as told by your health care provider. This is  important. °· Check your wound every day for signs of infection. Watch for: °¨ Redness, swelling, or pain. °¨ Fluid, blood, or pus. °· Raise (elevate) the injured area above the level of your heart while you are sitting or lying down, if possible. °SEEK MEDICAL CARE IF: °· You received a tetanus shot and you have swelling, severe pain, redness, or bleeding at the injection site. °· You have a fever. °· A wound that was closed breaks open. °· You notice a bad smell coming from your wound or your dressing. °· You notice something coming out of the wound, such as wood or glass. °· Your pain is not controlled with medicine. °· You have increased redness, swelling, or pain at the site of your wound. °· You have fluid, blood, or pus coming from your wound. °· You notice a change in the color of your skin near your wound. °· You need to change the dressing frequently due to fluid, blood, or pus draining from the wound. °· You develop a new rash. °· You develop numbness around the wound. °SEEK IMMEDIATE MEDICAL CARE IF: °· You develop severe swelling around the wound. °· Your pain suddenly increases and is severe. °· You develop painful lumps near the wound or on skin that is anywhere on your body. °· You have a red streak going away from your wound. °· The wound is on your hand or foot and you cannot properly move a finger or toe. °· The wound is on your hand or foot and you notice that your fingers or toes look pale or bluish. °  °This information is not intended to replace advice given to you by your health care provider. Make sure you discuss any questions you have with your health care provider. °  °Document Released: 02/18/2005 Document Revised: 07/05/2014 Document Reviewed: 02/14/2014 °Elsevier Interactive Patient Education ©2016 [employer] ° °

## 2015-03-03 NOTE — ED Notes (Addendum)
Ems from [facility] s/p fall. Pt with dried blood on neck and right head. Per ems, pt was walking with walker and fell backward striking head on wall. Hx dementia, htn, cabg.

## 2015-03-03 NOTE — ED Provider Notes (Signed)
[facility] Emergency Department Provider Note   ____________________________________________  Time seen: 1630  I have reviewed the triage vital signs and the nursing notes.   HISTORY  Chief Complaint Fall   History limited by: Dementia   HPI Rose Gates is a 81 y.o. female who presented to the emergency department today from living facility via EMS after a fall. The patient is unable to give a history. Apparently the patient was using the walker when she fell. Patient suffered a laceration to her head. Patient denied any pain to me however history and review of systems quite limited secondary to dementia.  Further history was obtained from family. They state the patient has a history of vertigo and has had multiple falls.   Past Medical History  Diagnosis Date  . Hypertension   . Coronary artery disease   . HLD (hyperlipidemia)   . A-fib (HCC)   . Pacemaker 2011-07  . Hypothyroidism   . GERD (gastroesophageal reflux disease)     Patient Active Problem List   Diagnosis Date Noted  . Forearm fracture 10/08/2014    Past Surgical History  Procedure Laterality Date  . Cardiac surgery    . Pacemaker insertion    . Cholecystectomy  1989  . Abdominal hysterectomy    . Coronary artery bypass graft  2005  . I&d extremity Right 10/08/2014    Procedure: IRRIGATION AND DEBRIDEMENT EXTREMITY;  Surgeon: Juanell Fairly, MD;  Location: [facility] ORS;  Service: Orthopedics;  Laterality: Right;  . Closed reduction radial shaft Right 10/08/2014    Procedure: CLOSED REDUCTION RADIAL SHAFT;  Surgeon: Juanell Fairly, MD;  Location: [facility] ORS;  Service: Orthopedics;  Laterality: Right;    Current Outpatient Rx  Name  Route  Sig  Dispense  Refill  . acetaminophen (TYLENOL) 325 MG tablet   Oral   Take 2 tablets (650 mg total) by mouth every 6 (six) hours as needed for mild pain (or Fever >/= 101).   90 tablet   0   . amLODipine (NORVASC) 5 MG tablet  Oral   Take 5 mg by mouth daily.         Marland Kitchen aspirin 81 MG tablet   Oral   Take 81 mg by mouth daily.         . cephALEXin (KEFLEX) 500 MG capsule   Oral   Take 1 capsule (500 mg total) by mouth 4 (four) times daily.   40 capsule   0   . HYDROcodone-acetaminophen (NORCO/VICODIN) 5-325 MG per tablet   Oral   Take 1-2 tablets by mouth every 4 (four) hours as needed for moderate pain.   30 tablet   0   . levothyroxine (SYNTHROID, LEVOTHROID) 25 MCG tablet   Oral   Take 25 mcg by mouth daily before breakfast.         . lisinopril (PRINIVIL,ZESTRIL) 5 MG tablet   Oral   Take 10 mg by mouth daily.         . sertraline (ZOLOFT) 25 MG tablet   Oral   Take 25 mg by mouth daily.           Allergies Codeine; Penicillins; and Sulfa antibiotics  Family History  Problem Relation Age of Onset  . Heart attack Mother   . Stroke Mother   . Rheum arthritis Father   . Rheum arthritis Sister     #1  . Rheum arthritis Sister     #2    Social History  Social History  Substance Use Topics  . Smoking status: Never Smoker   . Smokeless tobacco: None  . Alcohol Use: No    Review of Systems Unable to obtain secondary to dementia  ____________________________________________   PHYSICAL EXAM:  VITAL SIGNS: ED Triage Vitals  Enc Vitals Group     BP 03/03/15 1636 144/67 mmHg     Pulse Rate 03/03/15 1636 79     Resp --      Temp 03/03/15 1636 98.3 F (36.8 C)     Temp Source 03/03/15 1636 Axillary     SpO2 03/03/15 1636 98 %     Weight 03/03/15 1636 114 lb 10.2 oz (52 kg)     Height 03/03/15 1636 5\' 4"  (1.626 m)   Constitutional: Awake and alert. Not oriented. In no acute distress. Eyes: Conjunctivae are normal. PERRL. Normal extraocular movements. ENT   Head: Normocephalic. Blood in the patient's hair and small essentially skin tear to the occiput. No active bleeding.   Nose: No congestion/rhinnorhea.   Mouth/Throat: Mucous membranes are moist.    Neck: No stridor. No midline tenderness. Hematological/Lymphatic/Immunilogical: No cervical lymphadenopathy. Cardiovascular: Normal rate, regular rhythm.  No murmurs, rubs, or gallops. Respiratory: Normal respiratory effort without tachypnea nor retractions. Breath sounds are clear and equal bilaterally. No wheezes/rales/rhonchi. Gastrointestinal: Soft and nontender. No distention.  Genitourinary: Deferred Musculoskeletal: Normal range of motion in all extremities. No joint effusions.  No lower extremity tenderness nor edema. No pain or tenderness to any position or palpation of the hips. Pelvis stable. Neurologic:  Normal speech and language. No gross focal neurologic deficits are appreciated.  Skin:  Skin is warm, dry and intact. No rash noted. Psychiatric: Mood and affect are normal. Speech and behavior are normal. Patient exhibits appropriate insight and judgment.  ____________________________________________    LABS (pertinent positives/negatives)  None  ____________________________________________   EKG  None  ____________________________________________    RADIOLOGY  CT head/c-spine  IMPRESSION: 1. No acute intracranial abnormality. 2. Atrophy with chronic small vessel white matter ischemic demyelination. 3. Acute on chronic sphenoid sinusitis. 4. Diffuse degenerative changes in the cervical spine without evidence of fracture. 5. Loss of cervical lordosis. This can be related to patient positioning, muscle spasm or soft tissue injury.  ____________________________________________   PROCEDURES  Procedure(s) performed: None  Critical Care performed: No  ____________________________________________   INITIAL IMPRESSION / ASSESSMENT AND PLAN / ED COURSE  Pertinent labs & imaging results that were available during my care of the patient were reviewed by me and considered in my medical decision making (see chart for details).  Patient presented to the  emergency department today after a fall and laceration. Patient had a very superficial laceration to the occiput. No acute or active bleeding. CT head and C-spine negative. Had a discussion with the family he did not think any further workup required at this time given patient's history of vertigo and frequent falls. Will discharge back to living facility.  ____________________________________________   FINAL CLINICAL IMPRESSION(S) / ED DIAGNOSES  Final diagnoses:  Fall, initial encounter  Laceration     Phineas SemenGraydon Icelyn Navarrete, MD 03/03/15 [phone]

## 2015-03-16 ENCOUNTER — Emergency Department
Admission: EM | Admit: 2015-03-16 | Discharge: 2015-03-16 | Disposition: A | Discharge status: Home / Self Care | Payer: Medicare Other | Attending: Emergency Medicine | Admitting: Emergency Medicine

## 2015-03-16 ENCOUNTER — Encounter: Payer: Self-pay | Admitting: *Deleted

## 2015-03-16 ENCOUNTER — Emergency Department: Payer: Medicare Other

## 2015-03-16 DIAGNOSIS — Z7982 Long term (current) use of aspirin: Secondary | ICD-10-CM | POA: Diagnosis not present

## 2015-03-16 DIAGNOSIS — I10 Essential (primary) hypertension: Secondary | ICD-10-CM | POA: Diagnosis not present

## 2015-03-16 DIAGNOSIS — Z792 Long term (current) use of antibiotics: Secondary | ICD-10-CM | POA: Diagnosis not present

## 2015-03-16 DIAGNOSIS — N390 Urinary tract infection, site not specified: Secondary | ICD-10-CM | POA: Diagnosis not present

## 2015-03-16 DIAGNOSIS — Z79899 Other long term (current) drug therapy: Secondary | ICD-10-CM | POA: Insufficient documentation

## 2015-03-16 DIAGNOSIS — R4182 Altered mental status, unspecified: Secondary | ICD-10-CM | POA: Diagnosis present

## 2015-03-16 LAB — COMPREHENSIVE METABOLIC PANEL
ALT: 9 U/L — AB (ref 14–54)
AST: 19 U/L (ref 15–41)
Albumin: 3.3 g/dL — ABNORMAL LOW (ref 3.5–5.0)
Alkaline Phosphatase: 79 U/L (ref 38–126)
Anion gap: 7 (ref 5–15)
BUN: 22 mg/dL — ABNORMAL HIGH (ref 6–20)
CHLORIDE: 111 mmol/L (ref 101–111)
CO2: 26 mmol/L (ref 22–32)
CREATININE: 0.86 mg/dL (ref 0.44–1.00)
Calcium: 8.8 mg/dL — ABNORMAL LOW (ref 8.9–10.3)
GFR calc Af Amer: >60 mL/min (ref >60)
GFR, EST NON AFRICAN AMERICAN: 56 mL/min — AB (ref >60)
Glucose, Bld: 92 mg/dL (ref 65–99)
Potassium: 3.9 mmol/L (ref 3.5–5.1)
Sodium: 144 mmol/L (ref 135–145)
Total Bilirubin: 0.8 mg/dL (ref 0.3–1.2)
Total Protein: 6.8 g/dL (ref 6.5–8.1)

## 2015-03-16 LAB — CBC WITH DIFFERENTIAL/PLATELET
BASOS PCT: 1 %
Basophils Absolute: 0.1 10*3/uL (ref 0–0.1)
EOS ABS: 0.2 10*3/uL (ref 0–0.7)
Eosinophils Relative: 1 %
HCT: 36.4 % (ref 35.0–47.0)
HEMOGLOBIN: 12.2 g/dL (ref 12.0–16.0)
LYMPHS ABS: 1.7 10*3/uL (ref 1.0–3.6)
Lymphocytes Relative: 15 %
MCH: 29.7 pg (ref 26.0–34.0)
MCHC: 33.4 g/dL (ref 32.0–36.0)
MCV: 88.8 fL (ref 80.0–100.0)
MONO ABS: 0.9 10*3/uL (ref 0.2–0.9)
MONOS PCT: 8 %
Neutro Abs: 8.5 10*3/uL — ABNORMAL HIGH (ref 1.4–6.5)
Neutrophils Relative %: 75 %
Platelets: 364 10*3/uL (ref 150–440)
RBC: 4.1 MIL/uL (ref 3.80–5.20)
RDW: 13.6 % (ref 11.5–14.5)
WBC: 11.4 10*3/uL — ABNORMAL HIGH (ref 3.6–11.0)

## 2015-03-16 LAB — RAPID INFLUENZA A&B ANTIGENS (ARMC ONLY): INFLUENZA B (ARMC): NEGATIVE

## 2015-03-16 LAB — URINALYSIS COMPLETE WITH MICROSCOPIC (ARMC ONLY)
Bilirubin Urine: NEGATIVE
GLUCOSE, UA: NEGATIVE mg/dL
Hgb urine dipstick: NEGATIVE
Ketones, ur: NEGATIVE mg/dL
NITRITE: POSITIVE — AB
PROTEIN: NEGATIVE mg/dL
SPECIFIC GRAVITY, URINE: 1.021 (ref 1.005–1.030)
pH: 6 (ref 5.0–8.0)

## 2015-03-16 LAB — TROPONIN I
TROPONIN I: 0.03 ng/mL (ref <0.031)
TROPONIN I: 0.03 ng/mL (ref <0.031)

## 2015-03-16 LAB — LACTIC ACID, PLASMA
Lactic Acid, Venous: 2 mmol/L (ref 0.5–2.0)
Lactic Acid, Venous: 1.2 mmol/L (ref 0.5–2.0)

## 2015-03-16 LAB — RAPID INFLUENZA A&B ANTIGENS: Influenza A (ARMC): NEGATIVE

## 2015-03-16 MED ORDER — SULFAMETHOXAZOLE-TRIMETHOPRIM 800-160 MG PO TABS: 1.0000 | ORAL_TABLET | Freq: Two times a day (BID) | ORAL | Status: AC

## 2015-03-16 NOTE — ED Notes (Signed)
Pt arrives via EMS from [facility], states hx of dementia and alzheimers, staff states about an hour ago pt began to scream but with no real complaints, EMs reports pt 02 sat in the 80s upon their arrival, pt arrives on NRB, pt screaming, unable to state any specific complaints, pt moaning

## 2015-03-16 NOTE — ED Provider Notes (Signed)
[facility] Emergency Department Provider Note  ____________________________________________  Time seen: Approximately 10:31 AM  I have reviewed the triage vital signs and the nursing notes.   HISTORY  Chief Complaint Altered Mental Status  History Limited by altered mental status.   HPI Rose Gates is a 82 y.o. female who comes from the nursing home patient apparently began moaning and yelling an hour ago. Patient cannot say why she was moaning and yelling she is not currently moaning and yelling   Past Medical History  Diagnosis Date  . Hypertension   . Coronary artery disease   . HLD (hyperlipidemia)   . A-fib (HCC)   . Pacemaker 2011-07  . Hypothyroidism   . GERD (gastroesophageal reflux disease)     Patient Active Problem List   Diagnosis Date Noted  . Forearm fracture 10/08/2014    Past Surgical History  Procedure Laterality Date  . Cardiac surgery    . Pacemaker insertion    . Cholecystectomy  1989  . Abdominal hysterectomy    . Coronary artery bypass graft  2005  . I&d extremity Right 10/08/2014    Procedure: IRRIGATION AND DEBRIDEMENT EXTREMITY;  Surgeon: Juanell FairlyKevin Krasinski, MD;  Location: [facility] ORS;  Service: Orthopedics;  Laterality: Right;  . Closed reduction radial shaft Right 10/08/2014    Procedure: CLOSED REDUCTION RADIAL SHAFT;  Surgeon: Juanell FairlyKevin Krasinski, MD;  Location: [facility] ORS;  Service: Orthopedics;  Laterality: Right;    Current Outpatient Rx  Name  Route  Sig  Dispense  Refill  . amLODipine (NORVASC) 5 MG tablet   Oral   Take 5 mg by mouth daily.         Marland Kitchen. aspirin 81 MG chewable tablet   Oral   Chew 81 mg by mouth daily.         . hydrOXYzine (ATARAX/VISTARIL) 25 MG tablet   Oral   Take 25 mg by mouth every 6 (six) hours as needed for anxiety.         Marland Kitchen. levothyroxine (SYNTHROID, LEVOTHROID) 25 MCG tablet   Oral   Take 25 mcg by mouth daily before breakfast.         . lisinopril (PRINIVIL,ZESTRIL)  10 MG tablet   Oral   Take 1 tablet by mouth daily.         Marland Kitchen. LORazepam (ATIVAN) 0.5 MG tablet   Oral   Take 0.5 mg by mouth 2 (two) times daily as needed for anxiety.         . Melatonin 5 MG TABS   Oral   Take 1 tablet by mouth at bedtime.         . sertraline (ZOLOFT) 25 MG tablet   Oral   Take 25 mg by mouth daily.         Marland Kitchen. acetaminophen (TYLENOL) 325 MG tablet   Oral   Take 2 tablets (650 mg total) by mouth every 6 (six) hours as needed for mild pain (or Fever >/= 101). Patient not taking: Reported on 03/16/2015   90 tablet   0   . cephALEXin (KEFLEX) 500 MG capsule   Oral   Take 1 capsule (500 mg total) by mouth 4 (four) times daily. Patient not taking: Reported on 03/16/2015   40 capsule   0   . HYDROcodone-acetaminophen (NORCO/VICODIN) 5-325 MG per tablet   Oral   Take 1-2 tablets by mouth every 4 (four) hours as needed for moderate pain. Patient not taking: Reported on 03/16/2015  30 tablet   0   . sulfamethoxazole-trimethoprim (BACTRIM DS,SEPTRA DS) 800-160 MG tablet   Oral   Take 1 tablet by mouth 2 (two) times daily.   14 tablet   0     Allergies Review of patient's allergies indicates no active allergies.  Family History  Problem Relation Age of Onset  . Heart attack Mother   . Stroke Mother   . Rheum arthritis Father   . Rheum arthritis Sister     #1  . Rheum arthritis Sister     #2    Social History Social History  Substance Use Topics  . Smoking status: Never Smoker   . Smokeless tobacco: None  . Alcohol Use: No    Review of Systems Able to obtain  ____________________________________________   PHYSICAL EXAM:  VITAL SIGNS: ED Triage Vitals  Enc Vitals Group     BP --      Pulse Rate 03/16/15 1024 79     Resp 03/16/15 1024 24     Temp 03/16/15 1024 99 F (37.2 C)     Temp Source 03/16/15 1024 Axillary     SpO2 03/16/15 1022 80 %     Weight 03/16/15 1024 113 lb (51.256 kg)     Height 03/16/15 1024 5\' 2"  (1.575  m)     Head Cir --      Peak Flow --      Pain Score --      Pain Loc --      Pain Edu? --      Excl. in GC? --     Constitutional: Alert a Eyes: Conjunctivae are normal. PERRL. EOMI. Head: Atraumatic. Nose: No congestion/rhinnorhea. Mouth/Throat: Mucous membranes are moist.  Oropharynx non-erythematous. Neck: No stridor.  Cardiovascular: Normal rate, regular rhythm. Grossly normal heart sounds.  Good peripheral circulation. Respiratory: Normal respiratory effort.  No retractions. Lungs CTAB. Gastrointestinal: Soft and nontender. No distention. No abdominal bruits. No CVA tenderness. \Musculoskeletal: No lower extremity tenderness nor edema.  No joint effusions. Neurologic:  Normal speech and language. No gross focal neurologic deficits are appreciated. No gait instability. Skin:  Skin is warm, dry and intact. No rash noted.  ____________________________________________   LABS (all labs ordered are listed, but only abnormal results are displayed)  Labs Reviewed  COMPREHENSIVE METABOLIC PANEL - Abnormal; Notable for the following:    BUN 22 (*)    Calcium 8.8 (*)    Albumin 3.3 (*)    ALT 9 (*)    GFR calc non Af Amer 56 (*)    All other components within normal limits  CBC WITH DIFFERENTIAL/PLATELET - Abnormal; Notable for the following:    WBC 11.4 (*)    Neutro Abs 8.5 (*)    All other components within normal limits  URINALYSIS COMPLETEWITH MICROSCOPIC ([facility] ONLY) - Abnormal; Notable for the following:    Color, Urine AMBER (*)    APPearance CLOUDY (*)    Nitrite POSITIVE (*)    Leukocytes, UA TRACE (*)    Bacteria, UA FEW (*)    Squamous Epithelial / LPF 0-5 (*)    All other components within normal limits  URINE CULTURE  CULTURE, BLOOD (ROUTINE X 2)  CULTURE, BLOOD (ROUTINE X 2)  RAPID INFLUENZA A&B ANTIGENS ([facility] ONLY)  TROPONIN I  LACTIC ACID, PLASMA  LACTIC ACID, PLASMA  TROPONIN I   ____________________________________________  EKG  EKG shows a  fully paced rhythm at a rate of 88 pacer spikes do not show  up on the paper EKG but they show up on the monitor paced patient has a pacemaker in place and the EKG looks exactly the same as the prior EKG which was paced except for the pacer spikes and only showing up on a moderate amount of debris EKG ____________________________________________  RADIOLOGY  Radiologist reads a head CT as a possible lacunar infarct by the pons otherwise nothing there is no sign whether this infarct is new or not there are no prior EKGs to compare to ____________________________________________   PROCEDURES  Patient's family is now on room says she had calmed down and was acting normally when she came back from CT scan was moaning and apparently she does this a lot when she is cold when examiners. Complains about my cold hands and tells me to get my cold hands off of her. She stops moaning once she seems to get warm.  ____________________________________________   INITIAL IMPRESSION / ASSESSMENT AND PLAN / ED COURSE  Pertinent labs & imaging results that were available during my care of the patient were reviewed by me and considered in my medical decision making (see chart for details).   ____________________________________________   FINAL CLINICAL IMPRESSION(S) / ED DIAGNOSES  Final diagnoses:  Urinary tract infection without hematuria, site unspecified      Arnaldo Natal, MD 03/17/15 2343

## 2015-03-16 NOTE — ED Notes (Signed)
Md at bedside

## 2015-03-18 LAB — URINE CULTURE
Culture: >=100000
SPECIAL REQUESTS: NORMAL

## 2015-03-19 NOTE — Progress Notes (Signed)
ED culture report follow-up:  1/12 ED Urine culture+ E.coli resistant to Bactrim and Ampicillin. Patient discharged on Bactrim  Called [facility] [phone] and spoke to GoldsmithSheri.  Faxed culture result to this # and also to Dr. Arlana Pouchate [phone] per Lavonna RuaSheri.  Bari MantisKristin Margery Szostak PharmD Clinical Pharmacist 03/19/2015

## 2015-03-21 LAB — CULTURE, BLOOD (ROUTINE X 2)
CULTURE: NO GROWTH
CULTURE: NO GROWTH

## 2015-07-11 ENCOUNTER — Emergency Department
Admission: EM | Admit: 2015-07-11 | Discharge: 2015-07-12 | Disposition: A | Discharge status: Skilled Nursing Facility | Payer: Medicare Other | Attending: Emergency Medicine | Admitting: Emergency Medicine

## 2015-07-11 ENCOUNTER — Emergency Department: Payer: Medicare Other

## 2015-07-11 DIAGNOSIS — Z951 Presence of aortocoronary bypass graft: Secondary | ICD-10-CM | POA: Diagnosis not present

## 2015-07-11 DIAGNOSIS — I4891 Unspecified atrial fibrillation: Secondary | ICD-10-CM | POA: Diagnosis not present

## 2015-07-11 DIAGNOSIS — Z792 Long term (current) use of antibiotics: Secondary | ICD-10-CM | POA: Insufficient documentation

## 2015-07-11 DIAGNOSIS — E785 Hyperlipidemia, unspecified: Secondary | ICD-10-CM | POA: Insufficient documentation

## 2015-07-11 DIAGNOSIS — Z043 Encounter for examination and observation following other accident: Secondary | ICD-10-CM | POA: Insufficient documentation

## 2015-07-11 DIAGNOSIS — I10 Essential (primary) hypertension: Secondary | ICD-10-CM | POA: Diagnosis not present

## 2015-07-11 DIAGNOSIS — E039 Hypothyroidism, unspecified: Secondary | ICD-10-CM | POA: Insufficient documentation

## 2015-07-11 DIAGNOSIS — Z7982 Long term (current) use of aspirin: Secondary | ICD-10-CM | POA: Diagnosis not present

## 2015-07-11 DIAGNOSIS — Z7901 Long term (current) use of anticoagulants: Secondary | ICD-10-CM | POA: Diagnosis not present

## 2015-07-11 DIAGNOSIS — Z79899 Other long term (current) drug therapy: Secondary | ICD-10-CM | POA: Insufficient documentation

## 2015-07-11 DIAGNOSIS — Z950 Presence of cardiac pacemaker: Secondary | ICD-10-CM | POA: Diagnosis not present

## 2015-07-11 DIAGNOSIS — I2510 Atherosclerotic heart disease of native coronary artery without angina pectoris: Secondary | ICD-10-CM | POA: Diagnosis not present

## 2015-07-11 DIAGNOSIS — S0990XA Unspecified injury of head, initial encounter: Secondary | ICD-10-CM | POA: Insufficient documentation

## 2015-07-11 DIAGNOSIS — W19XXXA Unspecified fall, initial encounter: Secondary | ICD-10-CM

## 2015-07-11 NOTE — ED Notes (Signed)
Patient transported to CT 
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
## 2015-07-11 NOTE — ED Notes (Signed)
Pt awaiting EMS transport back to [facility]
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
## 2015-07-11 NOTE — Discharge Instructions (Signed)
Please seek medical attention for any high fevers, chest pain, shortness of breath, change in behavior, persistent vomiting, bloody stool or any other new or concerning symptoms.  

## 2015-09-02 DEATH — deceased
